# Patient Record
Sex: Female | Born: 1965 | Race: Asian | Hispanic: No | Marital: Married | Smoking: Never smoker
Health system: Southern US, Community
[De-identification: ages and names within clinical notes are randomized; demographics above are authoritative.]

## PROBLEM LIST (undated history)

## (undated) DIAGNOSIS — J45909 Unspecified asthma, uncomplicated: Secondary | ICD-10-CM

## (undated) DIAGNOSIS — M1990 Unspecified osteoarthritis, unspecified site: Secondary | ICD-10-CM

## (undated) DIAGNOSIS — Z87898 Personal history of other specified conditions: Secondary | ICD-10-CM

## (undated) DIAGNOSIS — T7840XA Allergy, unspecified, initial encounter: Secondary | ICD-10-CM

## (undated) DIAGNOSIS — I10 Essential (primary) hypertension: Secondary | ICD-10-CM

## (undated) DIAGNOSIS — Z8719 Personal history of other diseases of the digestive system: Secondary | ICD-10-CM

## (undated) HISTORY — DX: Unspecified osteoarthritis, unspecified site: M19.90

## (undated) HISTORY — PX: APPENDECTOMY: SHX54

## (undated) HISTORY — DX: Unspecified asthma, uncomplicated: J45.909

## (undated) HISTORY — DX: Personal history of other specified conditions: Z87.898

## (undated) HISTORY — PX: TUBAL LIGATION: SHX77

## (undated) HISTORY — DX: Allergy, unspecified, initial encounter: T78.40XA

## (undated) HISTORY — DX: Personal history of other diseases of the digestive system: Z87.19

---

## 1996-01-20 HISTORY — PX: CHOLECYSTECTOMY: SHX55

## 2010-01-19 HISTORY — PX: LAPAROSCOPY: SHX197

## 2010-03-13 ENCOUNTER — Emergency Department (HOSPITAL_BASED_OUTPATIENT_CLINIC_OR_DEPARTMENT_OTHER)
Admission: EM | Admit: 2010-03-13 | Discharge: 2010-03-14 | Disposition: A | Discharge status: Home / Self Care | Payer: Managed Care, Other (non HMO) | Attending: Emergency Medicine | Admitting: Emergency Medicine

## 2010-03-13 ENCOUNTER — Emergency Department (INDEPENDENT_AMBULATORY_CARE_PROVIDER_SITE_OTHER): Payer: Managed Care, Other (non HMO)

## 2010-03-13 DIAGNOSIS — K5732 Diverticulitis of large intestine without perforation or abscess without bleeding: Secondary | ICD-10-CM | POA: Insufficient documentation

## 2010-03-13 DIAGNOSIS — R197 Diarrhea, unspecified: Secondary | ICD-10-CM | POA: Insufficient documentation

## 2010-03-13 DIAGNOSIS — R112 Nausea with vomiting, unspecified: Secondary | ICD-10-CM | POA: Insufficient documentation

## 2010-03-13 DIAGNOSIS — R109 Unspecified abdominal pain: Secondary | ICD-10-CM | POA: Insufficient documentation

## 2010-03-13 DIAGNOSIS — R10819 Abdominal tenderness, unspecified site: Secondary | ICD-10-CM | POA: Insufficient documentation

## 2010-03-13 LAB — DIFFERENTIAL
Basophils Absolute: 0 10*3/uL (ref 0.0–0.1)
Basophils Relative: 0 % (ref 0–1)
Eosinophils Absolute: 0 10*3/uL (ref 0.0–0.7)
Eosinophils Relative: 0 % (ref 0–5)
Lymphocytes Relative: 14 % (ref 12–46)
Monocytes Absolute: 1 10*3/uL (ref 0.1–1.0)

## 2010-03-13 LAB — URINALYSIS, ROUTINE W REFLEX MICROSCOPIC
Ketones, ur: 15 mg/dL — AB
Nitrite: NEGATIVE
Protein, ur: NEGATIVE mg/dL
Urobilinogen, UA: 0.2 mg/dL (ref 0.0–1.0)

## 2010-03-13 LAB — COMPREHENSIVE METABOLIC PANEL
ALT: 24 U/L (ref 0–35)
Albumin: 4.3 g/dL (ref 3.5–5.2)
Calcium: 9.5 mg/dL (ref 8.4–10.5)
Glucose, Bld: 88 mg/dL (ref 70–99)
Sodium: 140 mEq/L (ref 135–145)
Total Protein: 8.2 g/dL (ref 6.0–8.3)

## 2010-03-13 LAB — LIPASE, BLOOD: Lipase: 85 U/L (ref 23–300)

## 2010-03-13 LAB — CBC
HCT: 42.1 % (ref 36.0–46.0)
MCHC: 35.2 g/dL (ref 30.0–36.0)
Platelets: 258 10*3/uL (ref 150–400)
RDW: 12.2 % (ref 11.5–15.5)

## 2010-03-13 MED ORDER — IOHEXOL 300 MG/ML  SOLN
100.0000 mL | Freq: Once | INTRAMUSCULAR | Status: AC | PRN
  Administered 2010-03-13: 100 mL via INTRAVENOUS

## 2010-10-28 ENCOUNTER — Ambulatory Visit (INDEPENDENT_AMBULATORY_CARE_PROVIDER_SITE_OTHER): Payer: Self-pay | Admitting: General Surgery

## 2010-11-18 ENCOUNTER — Ambulatory Visit (INDEPENDENT_AMBULATORY_CARE_PROVIDER_SITE_OTHER): Payer: Self-pay | Admitting: General Surgery

## 2011-01-20 HISTORY — PX: SIGMOIDOSCOPY: SUR1295

## 2011-07-13 LAB — HM COLONOSCOPY

## 2011-08-20 LAB — LIPID PANEL
CHOLESTEROL: 102 mg/dL (ref 0–200)
HDL: 36 mg/dL (ref 35–70)
LDL Cholesterol: 54 mg/dL
Triglycerides: 60 mg/dL (ref 40–160)

## 2011-09-01 LAB — HM PAP SMEAR: HM Pap smear: NORMAL

## 2011-09-09 LAB — HM MAMMOGRAPHY

## 2011-10-21 LAB — HM SIGMOIDOSCOPY

## 2012-01-20 HISTORY — PX: HERNIA REPAIR: SHX51

## 2012-08-16 LAB — HM MAMMOGRAPHY: HM Mammogram: NORMAL

## 2013-04-18 LAB — CBC AND DIFFERENTIAL
HEMATOCRIT: 41 % (ref 36–46)
Hemoglobin: 14.1 g/dL (ref 12.0–16.0)
Platelets: 309 10*3/uL (ref 150–399)
WBC: 5.7 10*3/mL

## 2013-04-18 LAB — BASIC METABOLIC PANEL
BUN: 6 mg/dL (ref 4–21)
Creatinine: 0.6 mg/dL (ref <=1.1)
GLUCOSE: 79 mg/dL
POTASSIUM: 3.6 mmol/L (ref 3.4–5.3)
SODIUM: 140 mmol/L (ref 137–147)

## 2013-04-18 LAB — HEPATIC FUNCTION PANEL
ALT: 19 U/L (ref 7–35)
AST: 36 U/L — AB (ref 13–35)
BILIRUBIN, TOTAL: 0.5 mg/dL

## 2013-12-19 ENCOUNTER — Encounter: Payer: Self-pay | Admitting: *Deleted

## 2013-12-19 ENCOUNTER — Ambulatory Visit (INDEPENDENT_AMBULATORY_CARE_PROVIDER_SITE_OTHER): Payer: BC Managed Care – PPO | Admitting: Internal Medicine

## 2013-12-19 ENCOUNTER — Encounter: Payer: Self-pay | Admitting: Internal Medicine

## 2013-12-19 VITALS — BP 110/72 | HR 82 | Temp 98.2°F | Resp 10 | Ht 63.0 in | Wt 139.0 lb

## 2013-12-19 DIAGNOSIS — G43909 Migraine, unspecified, not intractable, without status migrainosus: Secondary | ICD-10-CM | POA: Insufficient documentation

## 2013-12-19 DIAGNOSIS — K5900 Constipation, unspecified: Secondary | ICD-10-CM | POA: Insufficient documentation

## 2013-12-19 DIAGNOSIS — G43109 Migraine with aura, not intractable, without status migrainosus: Secondary | ICD-10-CM

## 2013-12-19 DIAGNOSIS — J45909 Unspecified asthma, uncomplicated: Secondary | ICD-10-CM | POA: Insufficient documentation

## 2013-12-19 DIAGNOSIS — J4520 Mild intermittent asthma, uncomplicated: Secondary | ICD-10-CM

## 2013-12-19 DIAGNOSIS — I83813 Varicose veins of bilateral lower extremities with pain: Secondary | ICD-10-CM

## 2013-12-19 MED ORDER — SUMATRIPTAN SUCCINATE 50 MG PO TABS: 50.0000 mg | ORAL_TABLET | Freq: Every day | ORAL | Status: DC | PRN

## 2013-12-19 NOTE — Progress Notes (Signed)
Patient ID: Linda Sloan, female   DOB: 10-Feb-1965, 48 y.o.   MRN: [id number]    Chief Complaint  Patient presents with  . Establish Care    New patient establish care   . Leg Pain    Bilateral leg pain, right leg is worse off/on. Right thigh feels numb at time since lipoma removed   Allergies  Allergen Reactions  . Clindamycin/Lincomycin   . Flagyl [Metronidazole]    HPI 48 y/o female patient is here to establish care. She was seeing Dr Montez Moritaarter at [facility] [place] prior to this. Last visit was in may 2015 as per pt She has hx of recurrent diverticulitis and underwent partial colectomy with anastomosis 2 years back. No complications reported. She also had hx of paravertebral lumbar muscle hypertrophy, underwent transverse TTP  She has hx of migraine, GERD, constipation. Currently off reflux medicine Has headache almost every other day and this could last for more than half the day. Light and nosie worsens it. Taking excedrin without much help.  She travelled to [place] in September and in order to delay her period started pill on 09/22/13-10/16/13. She had withdrawal bleeding after stopping her pills. While on pill, during the travel she felt pain in both her calf muscles right > left. She walked non stop for 5 days and then noticed swelling with bluish-greenish discoloration on her calf. The color has normalized but she continues to feels intermittent pain in her legs, both at rest and with exertion.   Review of Systems  Constitutional: Negative for fever, chills, diaphoresis.  HENT: Negative for congestion, hearing loss and sore throat.   Eyes: Negative for blurred vision, double vision and discharge.  Respiratory: Negative for cough, sputum production, shortness of breath and wheezing.   Cardiovascular: Negative for chest pain, palpitations, orthopnea and leg swelling.  Gastrointestinal: Negative for heartburn, nausea, vomiting, abdominal pain, diarrhea. Has regular bowel  movement on miralax and vegetable smoothie.   Genitourinary: Negative for dysuria, urgency, frequency and flank pain.  Musculoskeletal: Negative for back pain, falls Skin: Negative for itching and rash.  Neurological:  Negative for dizziness, tingling, focal weakness  Psychiatric/Behavioral: Negative for depression and memory loss. The patient is not nervous/anxious.    Past Medical History  Diagnosis Date  . History of diverticulitis of colon     Resolved 01/2012  . History of abdominal pain     Abdominal tenderness, LLQ   Past Surgical History  Procedure Laterality Date  . Cholecystectomy  1998  . Hernia repair  2014    Lipoma   Lancer, MD  . Sigmoidoscopy N/A 2013    Fiberoptic Olin PiaJames Dasher, MD  . Laparoscopy  2012    Corrie DandyMary D. Lanae BoastShearin, MD   No current outpatient prescriptions on file prior to visit.   No current facility-administered medications on file prior to visit.   Family History  Problem Relation Age of Onset  . Stroke Mother 2181  . Diabetes Mother 2481  . Arthritis Mother 5345  . Heart disease Father 40    heart attack  . Stroke Father 1888  . Cancer Brother 4260    History   Social History  . Marital Status: Married    Spouse Name: N/A    Number of Children: N/A  . Years of Education: N/A   Social History Main Topics  . Smoking status: Never Smoker   . Smokeless tobacco: Never Used  . Alcohol Use: No  . Drug Use: No  . Sexual Activity: None  Other Topics Concern  . None   Social History Narrative    Physical exam BP 110/72 mmHg  Pulse 82  Temp(Src) 98.2 F (36.8 C) (Oral)  Resp 10  Ht 5\' 3"  (1.6 m)  Wt 139 lb (63.05 kg)  BMI 24.63 kg/m2  SpO2 99%  General- adult female in no acute distress Head- atraumatic, normocephalic Eyes- PERRLA, EOMI, no pallor, no icterus, no discharge Neck- no lymphadenopathy Mouth- normal mucus membrane Cardiovascular- normal s1,s2, no murmurs, normal distal pulses Respiratory- bilateral clear to auscultation,  no wheeze, no rhonchi, no crackles Abdomen- bowel sounds present, soft, non tender Musculoskeletal- able to move all 4 extremities, no spinal and paraspinal tenderness, steady gait, no use of assistive device, normal range of motion, no leg edema Neurological- no focal deficit, normal muscle strength, normal sensation to fine touch Skin- warm and dry, varicose veings in both legs, right > left Psychiatry- alert and oriented to person, place and time, normal mood and affect  Labs- 04/18/13 wbc 5.7, hb 14.7, hct 40.7, plt 309, na 140, k 3.6, ca 8.9, glu 79, bun 6, cr 0.60, lft wnl  imaging Ct abdomen and pelvis - no acute finding, s/p cholecystectomy, stable small paraumbilical hernia containing omental fat.   Assessment/plan  1. Migraine with aura and without status migrainosus, not intractable D/c excedrin. Start sumitritpan 50 mg daily prn for now and reassess  2. Asthma, chronic, mild intermittent, uncomplicated Stable, off all meds at present  3. Constipation, unspecified constipation type Continue miralax and fiber supplement  4. Varicose veins of both lower extremities with pain The pain is likely from her varicose veins, check venous doppler to assess for venous blood flow, reflux and obstruction. She has Good distal pulses, no wound/ sores Advised on leg elevation at rest and to wear compression stockings. Reassess in 8 weeks or earlier  If venous doppler is normal, consider ABI to assess further - Lower Extremity Venous Duplex Bilateral; Future  Will need her prior records, review them and order labs

## 2014-01-01 ENCOUNTER — Ambulatory Visit (HOSPITAL_COMMUNITY)
Admission: RE | Admit: 2014-01-01 | Discharge: 2014-01-01 | Disposition: A | Discharge status: Home / Self Care | Payer: BC Managed Care – PPO | Source: Ambulatory Visit | Attending: Surgery | Admitting: Surgery

## 2014-01-01 DIAGNOSIS — I83813 Varicose veins of bilateral lower extremities with pain: Secondary | ICD-10-CM | POA: Diagnosis present

## 2014-02-27 ENCOUNTER — Ambulatory Visit: Payer: BC Managed Care – PPO | Admitting: Internal Medicine

## 2014-03-06 ENCOUNTER — Ambulatory Visit: Payer: Self-pay | Admitting: Internal Medicine

## 2014-03-13 ENCOUNTER — Encounter: Payer: Self-pay | Admitting: Internal Medicine

## 2014-05-07 ENCOUNTER — Ambulatory Visit (INDEPENDENT_AMBULATORY_CARE_PROVIDER_SITE_OTHER): Payer: BLUE CROSS/BLUE SHIELD | Admitting: Nurse Practitioner

## 2014-05-07 ENCOUNTER — Encounter: Payer: Self-pay | Admitting: Nurse Practitioner

## 2014-05-07 VITALS — BP 122/70 | HR 88 | Temp 98.1°F | Resp 18 | Ht 63.0 in | Wt 137.2 lb

## 2014-05-07 DIAGNOSIS — G43109 Migraine with aura, not intractable, without status migrainosus: Secondary | ICD-10-CM | POA: Diagnosis not present

## 2014-05-07 DIAGNOSIS — I83813 Varicose veins of bilateral lower extremities with pain: Secondary | ICD-10-CM | POA: Diagnosis not present

## 2014-05-07 NOTE — Patient Instructions (Signed)
Follow up in 1 month for EV with fasting blood work prior to visit   

## 2014-05-07 NOTE — Progress Notes (Signed)
Patient ID: Linda Sloan, female   DOB: 07/08/1965, 49 y.o.   MRN: [id number]    PCP: Sharon Seller, NP  Allergies  Allergen Reactions  . Clindamycin/Lincomycin   . Flagyl [Metronidazole]     Chief Complaint  Patient presents with  . Medical Management of Chronic Issues     HPI: Patient is a 49 y.o. female seen in the office today to follow up on chronic conditions. Pt was previously seen at [facility] but has now transferred to [facility] for care. Has not had recent blood work, reports she is due to for physical soon. Since last visit she has had to go to the ED once due to headaches since she has started Imitrex (which is better than previous). Does have some dizziness when she takes the medication but helps greatly with the headaches.  Reports she is still having leg pain. Still paying for the doppler that she had done. -doppler was negative for DVT.  -had worsening pain and saw doctor through work who prescribed diclofenac, she has only used once but does help -pain appears to be related to varicosities in her legs Miralax every other day which helps constipation  Review of Systems:  Review of Systems  Constitutional: Negative for activity change, appetite change, fatigue and unexpected weight change.  HENT: Negative for congestion and hearing loss.   Eyes: Negative.   Respiratory: Negative for cough and shortness of breath.   Cardiovascular: Negative for chest pain, palpitations and leg swelling.  Gastrointestinal: Positive for abdominal distention (bloating, cramps). Negative for abdominal pain, diarrhea and constipation.       Feeling of "inflammation", had colon surgery due to diverticulitis 3 years ago.   Genitourinary: Negative for dysuria and difficulty urinating.  Musculoskeletal: Positive for back pain. Negative for myalgias and arthralgias.  Skin: Negative for color change and wound.  Neurological: Negative for dizziness and weakness.  Psychiatric/Behavioral:  Negative for behavioral problems, confusion and agitation.    Past Medical History  Diagnosis Date  . History of diverticulitis of colon     Resolved 01/2012  . History of abdominal pain     Abdominal tenderness, LLQ   Past Surgical History  Procedure Laterality Date  . Cholecystectomy  1998  . Hernia repair  2014    Lipoma   Lancer, MD  . Sigmoidoscopy N/A 2013    Fiberoptic Olin Pia, MD  . Laparoscopy  2012    Corrie Dandy D. Lanae Boast, MD   Social History:   reports that she has never smoked. She has never used smokeless tobacco. She reports that she does not drink alcohol or use illicit drugs.  Family History  Problem Relation Age of Onset  . Stroke Mother 54  . Diabetes Mother 70  . Arthritis Mother 11  . Heart disease Father 40    heart attack  . Stroke Father 80  . Cancer Brother 71    Medications: Patient's Medications  New Prescriptions   No medications on file  Previous Medications   DICLOFENAC (VOLTAREN) 50 MG EC TABLET    Take 50 mg by mouth 3 (three) times daily as needed for mild pain (in calf of both legs).   POLYETHYLENE GLYCOL (MIRALAX / GLYCOLAX) PACKET    Take 17 g by mouth 2 (two) times a week.   SUMATRIPTAN (IMITREX) 50 MG TABLET    Take 1 tablet (50 mg total) by mouth daily as needed for migraine or headache. May repeat in 2 hours if headache persists or recurs.  Modified Medications   No medications on file  Discontinued Medications   No medications on file     Physical Exam:  Filed Vitals:   05/07/14 1441  BP: 122/70  Pulse: 88  Temp: 98.1 F (36.7 C)  TempSrc: Oral  Resp: 18  Height: 5\' 3"  (1.6 m)  Weight: 137 lb 3.2 oz (62.234 kg)  SpO2: 98%    Physical Exam  Constitutional: She is oriented to person, place, and time. She appears well-developed and well-nourished. No distress.  HENT:  Head: Normocephalic and atraumatic.  Mouth/Throat: Oropharynx is clear and moist. No oropharyngeal exudate.  Eyes: Conjunctivae are normal. Pupils  are equal, round, and reactive to light.  Neck: Normal range of motion. Neck supple.  Cardiovascular: Normal rate, regular rhythm and normal heart sounds.   varicose veins noted to bilateral LE  Pulmonary/Chest: Effort normal and breath sounds normal.  Abdominal: Soft. Bowel sounds are normal.  Musculoskeletal: She exhibits no edema or tenderness.  Neurological: She is alert and oriented to person, place, and time.  Skin: Skin is warm and dry. No rash noted. She is not diaphoretic. No erythema.  Psychiatric: She has a normal mood and affect.    Labs reviewed: Basic Metabolic Panel: No results for input(s): NA, K, CL, CO2, GLUCOSE, BUN, CREATININE, CALCIUM, MG, PHOS, TSH in the last 8760 hours. Liver Function Tests: No results for input(s): AST, ALT, ALKPHOS, BILITOT, PROT, ALBUMIN in the last 8760 hours. No results for input(s): LIPASE, AMYLASE in the last 8760 hours. No results for input(s): AMMONIA in the last 8760 hours. CBC: No results for input(s): WBC, NEUTROABS, HGB, HCT, MCV, PLT in the last 8760 hours. Lipid Panel: No results for input(s): CHOL, HDL, LDLCALC, TRIG, CHOLHDL, LDLDIRECT in the last 8760 hours. TSH: No results for input(s): TSH in the last 8760 hours. A1C: No results found for: HGBA1C   Assessment/Plan 1. Migraine with aura and without status migrainosus, not intractable -improved on imitrex as needed  2. Varicose veins of both lower extremities with pain -has used compression socks from store, which has helped but they are already wornout.  - Compression stockings rx given -may cont diclofenac PRN  3. Constipation  Controlled with miralax  -follow up in 1 month with fasting blood work prior to appt for physical

## 2014-05-08 ENCOUNTER — Ambulatory Visit: Payer: Self-pay | Admitting: Internal Medicine

## 2014-05-29 ENCOUNTER — Encounter: Payer: Self-pay | Admitting: Nurse Practitioner

## 2014-05-30 ENCOUNTER — Other Ambulatory Visit: Payer: BLUE CROSS/BLUE SHIELD

## 2014-05-30 ENCOUNTER — Other Ambulatory Visit: Payer: Self-pay | Admitting: *Deleted

## 2014-05-30 ENCOUNTER — Telehealth: Payer: Self-pay

## 2014-05-30 DIAGNOSIS — K5909 Other constipation: Secondary | ICD-10-CM

## 2014-05-30 DIAGNOSIS — J4520 Mild intermittent asthma, uncomplicated: Secondary | ICD-10-CM

## 2014-05-30 DIAGNOSIS — G43109 Migraine with aura, not intractable, without status migrainosus: Secondary | ICD-10-CM

## 2014-05-30 NOTE — Telephone Encounter (Signed)
Patient stopped by the office to request results of Venous Doppler completed December 2015. Patient states no one ever gave her the results of that report.   Patient filled out walk-in triage form and left.   I reviewed report and understood the impression to save Neg for DVT, other notes under impression need to be interpreted, please advise

## 2014-05-30 NOTE — Telephone Encounter (Signed)
Appears pt has follow up next week, please print report for review during OV

## 2014-05-30 NOTE — Telephone Encounter (Signed)
Linda BeechamCynthia spoke with patient and gave her a copy of report to discuss at pending appointment

## 2014-05-31 ENCOUNTER — Other Ambulatory Visit: Payer: BLUE CROSS/BLUE SHIELD

## 2014-05-31 LAB — CBC WITH DIFFERENTIAL/PLATELET
BASOS ABS: 0 10*3/uL (ref 0.0–0.2)
Basos: 0 %
EOS (ABSOLUTE): 0.1 10*3/uL (ref 0.0–0.4)
EOS: 2 %
Hematocrit: 45 % (ref 34.0–46.6)
Hemoglobin: 14.6 g/dL (ref 11.1–15.9)
Immature Grans (Abs): 0 10*3/uL (ref 0.0–0.1)
Immature Granulocytes: 0 %
LYMPHS ABS: 1.7 10*3/uL (ref 0.7–3.1)
LYMPHS: 34 %
MCH: 28.7 pg (ref 26.6–33.0)
MCHC: 32.4 g/dL (ref 31.5–35.7)
MCV: 88 fL (ref 79–97)
MONOCYTES: 8 %
Monocytes Absolute: 0.4 10*3/uL (ref 0.1–0.9)
NEUTROS PCT: 56 %
Neutrophils Absolute: 2.7 10*3/uL (ref 1.4–7.0)
PLATELETS: 263 10*3/uL (ref 150–379)
RBC: 5.09 x10E6/uL (ref 3.77–5.28)
RDW: 12.6 % (ref 12.3–15.4)
WBC: 5 10*3/uL (ref 3.4–10.8)

## 2014-05-31 LAB — COMPREHENSIVE METABOLIC PANEL
ALBUMIN: 4.2 g/dL (ref 3.5–5.5)
ALK PHOS: 72 IU/L (ref 39–117)
ALT: 33 IU/L — ABNORMAL HIGH (ref 0–32)
AST: 39 IU/L (ref 0–40)
Albumin/Globulin Ratio: 1.4 (ref 1.1–2.5)
BUN / CREAT RATIO: 15 (ref 9–23)
BUN: 11 mg/dL (ref 6–24)
Bilirubin Total: 0.3 mg/dL (ref 0.0–1.2)
CALCIUM: 9.4 mg/dL (ref 8.7–10.2)
CHLORIDE: 99 mmol/L (ref 97–108)
CO2: 24 mmol/L (ref 18–29)
Creatinine, Ser: 0.74 mg/dL (ref 0.57–1.00)
GFR calc Af Amer: 108 mL/min/{1.73_m2} (ref >59)
GFR calc non Af Amer: 94 mL/min/{1.73_m2} (ref >59)
Globulin, Total: 2.9 g/dL (ref 1.5–4.5)
Glucose: 88 mg/dL (ref 65–99)
Potassium: 4.3 mmol/L (ref 3.5–5.2)
Sodium: 139 mmol/L (ref 134–144)
Total Protein: 7.1 g/dL (ref 6.0–8.5)

## 2014-05-31 LAB — LIPID PANEL
CHOL/HDL RATIO: 3.4 ratio (ref 0.0–4.4)
CHOLESTEROL TOTAL: 125 mg/dL (ref 100–199)
HDL: 37 mg/dL — AB (ref >39)
LDL CALC: 74 mg/dL (ref 0–99)
Triglycerides: 68 mg/dL (ref 0–149)
VLDL CHOLESTEROL CAL: 14 mg/dL (ref 5–40)

## 2014-06-05 ENCOUNTER — Encounter: Payer: Self-pay | Admitting: Nurse Practitioner

## 2014-06-05 ENCOUNTER — Ambulatory Visit (INDEPENDENT_AMBULATORY_CARE_PROVIDER_SITE_OTHER): Payer: BLUE CROSS/BLUE SHIELD | Admitting: Nurse Practitioner

## 2014-06-05 VITALS — BP 102/68 | HR 93 | Temp 97.8°F | Resp 18 | Ht 63.0 in | Wt 138.6 lb

## 2014-06-05 DIAGNOSIS — R109 Unspecified abdominal pain: Secondary | ICD-10-CM

## 2014-06-05 DIAGNOSIS — G43109 Migraine with aura, not intractable, without status migrainosus: Secondary | ICD-10-CM | POA: Diagnosis not present

## 2014-06-05 DIAGNOSIS — I83813 Varicose veins of bilateral lower extremities with pain: Secondary | ICD-10-CM

## 2014-06-05 DIAGNOSIS — Z0000 Encounter for general adult medical examination without abnormal findings: Secondary | ICD-10-CM

## 2014-06-05 LAB — POCT URINALYSIS DIPSTICK
Bilirubin, UA: NEGATIVE
Blood, UA: NEGATIVE
GLUCOSE UA: NEGATIVE
KETONES UA: NEGATIVE
LEUKOCYTES UA: NEGATIVE
Nitrite, UA: NEGATIVE
PROTEIN UA: NEGATIVE
Spec Grav, UA: 1.01
Urobilinogen, UA: 0.2
pH, UA: 5

## 2014-06-05 MED ORDER — LUBIPROSTONE 8 MCG PO CAPS: 8.0000 ug | ORAL_CAPSULE | Freq: Two times a day (BID) | ORAL | Status: DC

## 2014-06-05 NOTE — Patient Instructions (Addendum)
Urine is negative, will treat for constipation with amitiza 8 mcg by mouth twice daily with food   Follow up in 2-3 weeks

## 2014-06-05 NOTE — Progress Notes (Signed)
Patient ID: Linda Sloan, female   DOB: 07-27-1965, 49 y.o.   MRN: [id number]    PCP: Sharon Seller, NP  Allergies  Allergen Reactions  . Clindamycin/Lincomycin   . Flagyl [Metronidazole]     Chief Complaint  Patient presents with  . Annual Exam    Annual exam, Discuss labs (copy printed )     HPI: Patient is a 49 y.o. female seen in the office today for extended visit.  Following with GYN for Pelvic/PAP, also gets clinical breast exam there. Increased abdominal pain, contributes to inflammatory process ?GYN issues.  Screenings: Colon Cancer- colonoscopy 2013 Breast Cancer- mammogram 11/2013  Cervical Cancer- PAP in 2013   Vaccines Up to date on: influenza Need: Tdap  Smoking status: nonsmoker Alcohol use: never  Dentist: every 6 months Ophthalmologist: yearly  Exercise regimen: walking daily- 30-40 mins Diet: attempts heart healthy   Specialist- both at [facility]  GI- Dr. Jasmine December GYN- Dr. Patton Salles  Reports increased abdominal pain, worse than before, feels bloated, more constipated, miralax makes it worse  Migraine medication makes her dizzy but does help her migraines.   Advanced Directive information Does patient have an advance directive?: No, Does patient want to make changes to advanced directive?: Yes - information given Review of Systems:  Review of Systems  Constitutional: Negative for activity change, appetite change, fatigue and unexpected weight change.  HENT: Negative for congestion and hearing loss.   Eyes: Negative.   Respiratory: Negative for cough and shortness of breath.   Cardiovascular: Negative for chest pain, palpitations and leg swelling.  Gastrointestinal: Positive for abdominal pain, constipation and abdominal distention. Negative for nausea, vomiting and diarrhea.       Feels something is not right on the inside of abdomen- started 2 months ago.  Feels constipated but feels like miralax makes it worse.      Genitourinary: Positive for frequency. Negative for dysuria and difficulty urinating.  Musculoskeletal: Positive for back pain. Negative for myalgias and arthralgias.  Skin: Negative for color change and wound.  Neurological: Negative for dizziness and weakness.  Psychiatric/Behavioral: Negative for behavioral problems, confusion and agitation.    Past Medical History  Diagnosis Date  . History of diverticulitis of colon     Resolved 01/2012  . History of abdominal pain     Abdominal tenderness, LLQ   Past Surgical History  Procedure Laterality Date  . Cholecystectomy  1998  . Hernia repair  2014    Lipoma   Lancer, MD  . Sigmoidoscopy N/A 2013    Fiberoptic Olin Pia, MD  . Laparoscopy  2012    Corrie Dandy D. Lanae Boast, MD   Social History:   reports that she has never smoked. She has never used smokeless tobacco. She reports that she does not drink alcohol or use illicit drugs.  Family History  Problem Relation Age of Onset  . Stroke Mother 7  . Diabetes Mother 34  . Arthritis Mother 34  . Heart disease Father 40    heart attack  . Stroke Father 2  . Cancer Brother 71    Medications: Patient's Medications  New Prescriptions   No medications on file  Previous Medications   DICLOFENAC (VOLTAREN) 50 MG EC TABLET    Take 50 mg by mouth 3 (three) times daily as needed for mild pain (in calf of both legs).   POLYETHYLENE GLYCOL (MIRALAX / GLYCOLAX) PACKET    Take 17 g by mouth 2 (two) times a week.   SUMATRIPTAN (  IMITREX) 50 MG TABLET    Take 1 tablet (50 mg total) by mouth daily as needed for migraine or headache. May repeat in 2 hours if headache persists or recurs.  Modified Medications   No medications on file  Discontinued Medications   No medications on file     Physical Exam:  Filed Vitals:   06/05/14 0900  BP: 102/68  Pulse: 93  Temp: 97.8 F (36.6 C)  TempSrc: Oral  Resp: 18  Height: 5\' 3"  (1.6 m)  Weight: 138 lb 9.6 oz (62.869 kg)  SpO2: 97%     Physical Exam  Constitutional: She is oriented to person, place, and time. She appears well-developed and well-nourished. No distress.  HENT:  Head: Normocephalic and atraumatic.  Mouth/Throat: Oropharynx is clear and moist. No oropharyngeal exudate.  Eyes: Conjunctivae are normal. Pupils are equal, round, and reactive to light.  Neck: Normal range of motion. Neck supple.  Cardiovascular: Normal rate, regular rhythm and normal heart sounds.   Pulmonary/Chest: Effort normal and breath sounds normal.  Abdominal: Soft. Bowel sounds are normal. There is tenderness (to lower abdomen).  Genitourinary:  Deferred- pelvis and breast exams being done by GYN  Musculoskeletal: She exhibits no edema or tenderness.  Neurological: She is alert and oriented to person, place, and time.  Skin: Skin is warm and dry. She is not diaphoretic.  Psychiatric: She has a normal mood and affect.    Labs reviewed: Basic Metabolic Panel:  Recent Labs  81/19/1403/12/05 0827  NA 139  K 4.3  CL 99  CO2 24  GLUCOSE 88  BUN 11  CREATININE 0.74  CALCIUM 9.4   Liver Function Tests:  Recent Labs  05/30/14 0827  AST 39  ALT 33*  ALKPHOS 72  BILITOT 0.3  PROT 7.1   No results for input(s): LIPASE, AMYLASE in the last 8760 hours. No results for input(s): AMMONIA in the last 8760 hours. CBC:  Recent Labs  05/30/14 0904  WBC 5.0  NEUTROABS 2.7  HCT 45.0   Lipid Panel:  Recent Labs  05/30/14 0827  CHOL 125  HDL 37*  LDLCALC 74  TRIG 68  CHOLHDL 3.4   TSH: No results for input(s): TSH in the last 8760 hours. A1C: No results found for: HGBA1C   Assessment/Plan 1. Abdominal pain, unspecified abdominal location -with cramping, may be related to constipation vs inflammatory process, pt with hx of diverticulitis. Recent CBC reviewed without abnormal WBCs, - POC Urinalysis Dipstick- reviewed and negative  -will treat for constipation  - lubiprostone (AMITIZA) 8 MCG capsule; Take 1 capsule (8  mcg total) by mouth 2 (two) times daily with a meal. -also to take florastor PO BID   2. Preventative health care -normal exam, except mild discomfort on abdominal exam  -PREVENTIVE COUNSELING:  The patient was counseled regarding the appropriate use of alcohol, regular self-examination of the breasts on a monthly basis, prevention of dental and periodontal disease, diet, regular sustained exercise for at least 30 minutes 5 times per week, routine screening interval for mammogram as recommended by the American Cancer Society and ACOG, importance of regular PAP smears, and recommended schedule for GI hemoccult testing, colonoscopy, cholesterol, thyroid and diabetes screening.  3. Migraine with aura and without status migrainosus, not intractable -may try to cut Imitrex 50 mg in half to help with side effects   4. Varicose veins of both lower extremities with pain -reviewed lower extremity venous duplex reflux evaluation in detail with pt, to cont to wear  compression hose.   Follow up in 2-3 weeks

## 2014-07-03 ENCOUNTER — Encounter: Payer: Self-pay | Admitting: Nurse Practitioner

## 2014-07-03 ENCOUNTER — Ambulatory Visit: Payer: BLUE CROSS/BLUE SHIELD | Admitting: Nurse Practitioner

## 2014-07-31 ENCOUNTER — Encounter: Payer: Self-pay | Admitting: *Deleted

## 2014-09-13 ENCOUNTER — Emergency Department (HOSPITAL_COMMUNITY)
Admission: EM | Admit: 2014-09-13 | Discharge: 2014-09-13 | Disposition: A | Discharge status: Home / Self Care | Payer: Worker's Compensation | Attending: Emergency Medicine | Admitting: Emergency Medicine

## 2014-09-13 ENCOUNTER — Encounter (HOSPITAL_COMMUNITY): Payer: Self-pay

## 2014-09-13 DIAGNOSIS — S199XXA Unspecified injury of neck, initial encounter: Secondary | ICD-10-CM | POA: Diagnosis present

## 2014-09-13 DIAGNOSIS — S3992XA Unspecified injury of lower back, initial encounter: Secondary | ICD-10-CM | POA: Insufficient documentation

## 2014-09-13 DIAGNOSIS — Z79899 Other long term (current) drug therapy: Secondary | ICD-10-CM | POA: Diagnosis not present

## 2014-09-13 DIAGNOSIS — Y92410 Unspecified street and highway as the place of occurrence of the external cause: Secondary | ICD-10-CM | POA: Diagnosis not present

## 2014-09-13 DIAGNOSIS — S0993XA Unspecified injury of face, initial encounter: Secondary | ICD-10-CM | POA: Insufficient documentation

## 2014-09-13 DIAGNOSIS — Y998 Other external cause status: Secondary | ICD-10-CM | POA: Diagnosis not present

## 2014-09-13 DIAGNOSIS — S8991XA Unspecified injury of right lower leg, initial encounter: Secondary | ICD-10-CM | POA: Insufficient documentation

## 2014-09-13 DIAGNOSIS — S59912A Unspecified injury of left forearm, initial encounter: Secondary | ICD-10-CM | POA: Diagnosis not present

## 2014-09-13 DIAGNOSIS — Y9389 Activity, other specified: Secondary | ICD-10-CM | POA: Diagnosis not present

## 2014-09-13 DIAGNOSIS — S8992XA Unspecified injury of left lower leg, initial encounter: Secondary | ICD-10-CM | POA: Diagnosis not present

## 2014-09-13 MED ORDER — METHOCARBAMOL 500 MG PO TABS: 500.0000 mg | ORAL_TABLET | Freq: Two times a day (BID) | ORAL | Status: DC

## 2014-09-13 MED ORDER — TRAMADOL HCL 50 MG PO TABS: 50.0000 mg | ORAL_TABLET | Freq: Four times a day (QID) | ORAL | Status: DC | PRN

## 2014-09-13 MED ORDER — TRAMADOL HCL 50 MG PO TABS
50.0000 mg | ORAL_TABLET | Freq: Once | ORAL | Status: AC
Start: 2014-09-13 — End: 2014-09-13
  Administered 2014-09-13: 50 mg via ORAL
  Filled 2014-09-13: qty 1
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
## 2014-09-13 NOTE — Discharge Instructions (Signed)

## 2014-10-31 ENCOUNTER — Other Ambulatory Visit: Payer: Self-pay | Admitting: Internal Medicine

## 2014-11-02 ENCOUNTER — Encounter: Payer: Self-pay | Admitting: Internal Medicine

## 2014-11-02 ENCOUNTER — Ambulatory Visit (INDEPENDENT_AMBULATORY_CARE_PROVIDER_SITE_OTHER): Payer: BLUE CROSS/BLUE SHIELD | Admitting: Internal Medicine

## 2014-11-02 VITALS — BP 120/82 | HR 80 | Temp 98.1°F | Ht 63.0 in | Wt 140.0 lb

## 2014-11-02 DIAGNOSIS — M25511 Pain in right shoulder: Secondary | ICD-10-CM

## 2014-11-02 DIAGNOSIS — M62838 Other muscle spasm: Secondary | ICD-10-CM | POA: Diagnosis not present

## 2014-11-02 DIAGNOSIS — M546 Pain in thoracic spine: Secondary | ICD-10-CM | POA: Diagnosis not present

## 2014-11-02 DIAGNOSIS — M542 Cervicalgia: Secondary | ICD-10-CM | POA: Diagnosis not present

## 2014-11-02 DIAGNOSIS — M545 Low back pain: Secondary | ICD-10-CM

## 2014-11-02 DIAGNOSIS — M25512 Pain in left shoulder: Secondary | ICD-10-CM

## 2014-11-02 MED ORDER — TRAMADOL HCL 50 MG PO TABS: 50.0000 mg | ORAL_TABLET | Freq: Four times a day (QID) | ORAL | Status: DC | PRN

## 2014-11-02 MED ORDER — METHYLPREDNISOLONE ACETATE 80 MG/ML IJ SUSP
80.0000 mg | Freq: Once | INTRAMUSCULAR | Status: AC
  Administered 2014-11-02: 80 mg via INTRAMUSCULAR

## 2014-11-02 MED ORDER — PREDNISONE 10 MG PO TABS: ORAL_TABLET | ORAL | Status: DC

## 2014-11-02 MED ORDER — METHOCARBAMOL 500 MG PO TABS: 500.0000 mg | ORAL_TABLET | Freq: Two times a day (BID) | ORAL | Status: DC

## 2014-11-02 NOTE — Patient Instructions (Addendum)
Check to see if insurance covers PT (physical therapy) or chiropractor for therapy  Take medications as ordered  No heavy lifting  Recommend Orthopedic eval and imaging studies (need xrays of neck, shoulder mid and lower back). May need MRI  DO NO START PREDNISONE UNTIL 11/03/14  Follow up as scheduled.  Hold flu/pneumovax until prednisone course completed
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
## 2015-04-11 DIAGNOSIS — Z23 Encounter for immunization: Secondary | ICD-10-CM

## 2015-04-29 NOTE — Congregational Nurse Program (Signed)
Congregational Nurse Program Note  Date of Encounter: 04/11/2015  Past Medical History: Past Medical History  Diagnosis Date  . History of diverticulitis of colon     Resolved 01/2012  . History of abdominal pain     Abdominal tenderness, LLQ    Encounter Details:     CNP Questionnaire - 04/29/15 1023    Patient Demographics   Is this a new or existing patient? New   Patient is considered a/an Not Applicable   Race Asian   Patient Assistance   Location of Patient [facility]   Patient's financial/insurance status Private Insurance Coverage   Uninsured Patient No   Patient referred to apply for the following financial assistance Not Applicable   Food insecurities addressed Not Applicable   Transportation assistance No   Assistance securing medications No   Educational health offerings Health literacy   Encounter Details   Primary purpose of visit Other  flu vaccine   Was an Emergency Department visit averted? No   Does patient have a medical provider? Yes   Patient referred to Not Applicable   Was a mental health screening completed? (GAINS tool) No   Does patient have dental issues? No   Does patient have vision issues? No   Since previous encounter, have you referred patient for abnormal blood pressure that resulted in a new diagnosis or medication change? No   Since previous encounter, have you referred patient for abnormal blood glucose that resulted in a new diagnosis or medication change? No   For Abstraction Use Only   Does patient have insurance? Yes       Client staff member who wants a flu vaccine, given to client

## 2015-05-09 ENCOUNTER — Telehealth: Payer: Self-pay | Admitting: Nurse Practitioner

## 2015-05-09 DIAGNOSIS — Z0000 Encounter for general adult medical examination without abnormal findings: Secondary | ICD-10-CM

## 2015-05-09 NOTE — Telephone Encounter (Signed)
Sent request to Clinical Lead for Lab orders via CPX on May 19th..Linda Sloan

## 2015-05-09 NOTE — Telephone Encounter (Signed)
Future orders placed 

## 2015-05-24 ENCOUNTER — Other Ambulatory Visit: Payer: BLUE CROSS/BLUE SHIELD

## 2015-05-24 DIAGNOSIS — Z0000 Encounter for general adult medical examination without abnormal findings: Secondary | ICD-10-CM

## 2015-05-25 LAB — CBC WITH DIFFERENTIAL/PLATELET
Basophils Absolute: 0 10*3/uL (ref 0.0–0.2)
Basos: 1 %
EOS (ABSOLUTE): 0.1 10*3/uL (ref 0.0–0.4)
EOS: 2 %
HEMATOCRIT: 43.5 % (ref 34.0–46.6)
HEMOGLOBIN: 14.8 g/dL (ref 11.1–15.9)
Immature Grans (Abs): 0 10*3/uL (ref 0.0–0.1)
Immature Granulocytes: 0 %
LYMPHS ABS: 1.5 10*3/uL (ref 0.7–3.1)
Lymphs: 35 %
MCH: 29.6 pg (ref 26.6–33.0)
MCHC: 34 g/dL (ref 31.5–35.7)
MCV: 87 fL (ref 79–97)
MONOCYTES: 9 %
Monocytes Absolute: 0.4 10*3/uL (ref 0.1–0.9)
NEUTROS ABS: 2.3 10*3/uL (ref 1.4–7.0)
Neutrophils: 53 %
Platelets: 278 10*3/uL (ref 150–379)
RBC: 5 x10E6/uL (ref 3.77–5.28)
RDW: 12.5 % (ref 12.3–15.4)
WBC: 4.3 10*3/uL (ref 3.4–10.8)

## 2015-05-25 LAB — BASIC METABOLIC PANEL
BUN / CREAT RATIO: 11 (ref 9–23)
BUN: 9 mg/dL (ref 6–24)
CHLORIDE: 101 mmol/L (ref 96–106)
CO2: 21 mmol/L (ref 18–29)
Calcium: 9 mg/dL (ref 8.7–10.2)
Creatinine, Ser: 0.79 mg/dL (ref 0.57–1.00)
GFR calc Af Amer: 100 mL/min/{1.73_m2} (ref >59)
GFR calc non Af Amer: 86 mL/min/{1.73_m2} (ref >59)
GLUCOSE: 91 mg/dL (ref 65–99)
Potassium: 4.3 mmol/L (ref 3.5–5.2)
SODIUM: 142 mmol/L (ref 134–144)

## 2015-05-25 LAB — LIPID PANEL
Chol/HDL Ratio: 3.6 ratio units (ref 0.0–4.4)
Cholesterol, Total: 120 mg/dL (ref 100–199)
HDL: 33 mg/dL — ABNORMAL LOW (ref >39)
LDL CALC: 73 mg/dL (ref 0–99)
Triglycerides: 68 mg/dL (ref 0–149)
VLDL CHOLESTEROL CAL: 14 mg/dL (ref 5–40)

## 2015-05-25 LAB — TSH: TSH: 5.57 u[IU]/mL — ABNORMAL HIGH (ref 0.450–4.500)

## 2015-06-03 ENCOUNTER — Ambulatory Visit (INDEPENDENT_AMBULATORY_CARE_PROVIDER_SITE_OTHER): Payer: Worker's Compensation | Admitting: Family Medicine

## 2015-06-03 ENCOUNTER — Ambulatory Visit: Payer: BLUE CROSS/BLUE SHIELD

## 2015-06-03 VITALS — BP 120/76 | HR 80 | Temp 99.0°F | Resp 16

## 2015-06-03 DIAGNOSIS — W19XXXA Unspecified fall, initial encounter: Secondary | ICD-10-CM

## 2015-06-03 DIAGNOSIS — M25571 Pain in right ankle and joints of right foot: Secondary | ICD-10-CM

## 2015-06-03 DIAGNOSIS — S96911A Strain of unspecified muscle and tendon at ankle and foot level, right foot, initial encounter: Secondary | ICD-10-CM | POA: Diagnosis not present

## 2015-06-03 DIAGNOSIS — S93401A Sprain of unspecified ligament of right ankle, initial encounter: Secondary | ICD-10-CM

## 2015-06-03 MED ORDER — IBUPROFEN 200 MG PO TABS
600.0000 mg | ORAL_TABLET | Freq: Once | ORAL | Status: AC
  Administered 2015-06-03: 600 mg via ORAL

## 2015-06-03 NOTE — Progress Notes (Signed)
Patient ID: Linda Sloan, female    DOB: Dec 24, 1965  Age: 50 y.o. MRN: [id number]  Chief Complaint  Patient presents with  . 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 She was on grass and doesn't know what made her fall, but she fell injuring her right foot. She thought it would be okay, but progressively it is developed more pain to where she cannot even get up to the bathroom. She is not taking any medications.  Current allergies, medications, problem list, past/family and social histories reviewed.  Objective:  BP 120/76 mmHg  Pulse 80  Temp(Src) 99 F (37.2 C) (Oral)  Resp 16  Ht   Wt   SpO2 100%  LMP 05/19/2015  Right foot has some swelling below the lateral malleolus. She has tenderness in multiple areas of foot, but especially across the midfoot, at the proximal first metatarsal, and some of the lateral aspect. It is most tender just anterior to the lateral malleolus.  Assessment & Plan:   Assessment: 1. Pain in joint, ankle and foot, right   2. Fall, initial encounter   3. Sprain of ankle, right, initial encounter   4. Strain of foot, right, initial encounter       Plan: Check x-rays  Orders Placed This Encounter  Procedures  . DG Foot 2 Views Right    Order Specific Question:  Reason for Exam (SYMPTOM  OR DIAGNOSIS REQUIRED)    Answer:  fell, turned foot, pain    Order Specific Question:  Is the patient pregnant?    Answer:  No     Comments:  lmp 1 week ago    Order Specific Question:  Preferred imaging location?    Answer:  External  . DG Ankle Complete Right    Order Specific Question:  Reason for Exam (SYMPTOM  OR DIAGNOSIS REQUIRED)    Answer:  fell, turned foot, pain    Order Specific Question:  Is the patient pregnant?    Answer:  No     Comments:  lmp 1 week ago    Order Specific Question:  Preferred imaging location?    Answer:  External    Meds  ordered this encounter  Medications  . ibuprofen (ADVIL,MOTRIN) tablet 600 mg    Sig:    No fracture seen. Radiologist reading is still pending  Will treat with crutches, Cam Walker, elevation, ice, ibuprofen.      Patient Instructions   Out of work until next Monday  Elevate foot as often as possible  Apply ice to foot and ankle for about 15 minutes 4 or 5 times daily for the next few days  Take ibuprofen 200 mg 2 or 3 pills every 6-8 hours as needed for pain  Use crutches  Wear cam walker  We will let you know if the radiologist sees anything differently on the x-rays.  Return next Monday or sooner if needed.    IF you received an x-ray today, you will receive an invoice from [facility]. Please contact [facility] at [phone] with questions or concerns regarding your invoice.   IF you received labwork today, you will receive an invoice from [employer]. Please contact [facility] at [phone] with questions or concerns regarding your invoice.   Our billing staff will not be able to assist you with questions regarding bills from these companies.  You will  be contacted with the lab results as soon as they are available. The fastest way to get your results is to activate your My Chart account. Instructions are located on the last page of this paperwork. If you have not heard from us regarding the results in 2 weeks, please contact this office.         Return in about 1 week (around 06/10/2015).   HOPPER,DAVID, MD 06/03/2015

## 2015-06-03 NOTE — Patient Instructions (Addendum)
Out of work until next Monday  Elevate foot as often as possible  Apply ice to foot and ankle for about 15 minutes 4 or 5 times daily for the next few days  Take ibuprofen 200 mg 2 or 3 pills every 6-8 hours as needed for pain  Use crutches  Wear cam walker  We will let you know if the radiologist sees anything differently on the x-rays.  Return next Monday or sooner if needed.    IF you received an x-ray today, you will receive an invoice from [facility]. Please contact [facility] at [phone] with questions or concerns regarding your invoice.   IF you received labwork today, you will receive an invoice from [facility]. Please contact [facility] at [phone] with questions or concerns regarding your invoice.   Our billing staff will not be able to assist you with questions regarding bills from these companies.  You will be contacted with the lab results as soon as they are available. The fastest way to get your results is to activate your My Chart account. Instructions are located on the last page of this paperwork. If you have not heard from us regarding the results in 2 weeks, please contact this office.

## 2015-06-07 ENCOUNTER — Encounter: Payer: BLUE CROSS/BLUE SHIELD | Admitting: Internal Medicine

## 2015-06-08 ENCOUNTER — Ambulatory Visit (INDEPENDENT_AMBULATORY_CARE_PROVIDER_SITE_OTHER): Payer: Worker's Compensation | Admitting: Physician Assistant

## 2015-06-08 VITALS — BP 104/62 | HR 78 | Temp 98.3°F | Resp 18 | Ht 63.0 in | Wt 143.0 lb

## 2015-06-08 DIAGNOSIS — W19XXXD Unspecified fall, subsequent encounter: Secondary | ICD-10-CM | POA: Diagnosis not present

## 2015-06-08 DIAGNOSIS — S93401A Sprain of unspecified ligament of right ankle, initial encounter: Secondary | ICD-10-CM

## 2015-06-08 DIAGNOSIS — M25571 Pain in right ankle and joints of right foot: Secondary | ICD-10-CM | POA: Diagnosis not present

## 2015-06-08 NOTE — Progress Notes (Signed)
MRN: [id number] DOB: [birth year]  Subjective:  Pt presents to clinic with an injury that occurred at work on 5/15 when she slipped.  She is feeling better.  She has been at home since the incident and has been walking without the camwalker and feels fine.  Today she has slightly more pain because she has been walking more than normal.  The pain is on the lateral aspect of the ankle mainly under the malleolus.  She has almost no swelling and no ecchymosis.    Review of Systems  Musculoskeletal: Positive for joint swelling (mild right ankle). Negative for gait problem.    Objective:  BP 104/62 mmHg  Pulse 78  Temp(Src) 98.3 F (36.8 C) (Oral)  Resp 18  Ht 5\' 3"  (1.6 m)  Wt 143 lb (64.864 kg)  BMI 25.34 kg/m2  SpO2 97%  LMP 05/19/2015  Physical Exam  Constitutional: She is oriented to person, place, and time and well-developed, well-nourished, and in no distress.  HENT:  Head: Normocephalic and atraumatic.  Right Ear: Hearing and external ear normal.  Left Ear: Hearing and external ear normal.  Eyes: Conjunctivae are normal.  Neck: Normal range of motion.  Pulmonary/Chest: Effort normal.  Musculoskeletal:       Right ankle: She exhibits swelling (distal and anterior to the lateral malleolus). She exhibits normal range of motion and no ecchymosis. Tenderness. AITFL tenderness found. No lateral malleolus and no medial malleolus tenderness found. Achilles tendon normal.       Left ankle: Normal.  Good strength, normal ROM without pain with flexion and extension of the foot, some pain with inversion and eversion of the ankle on the lateral aspect with both movements  Neurological: She is alert and oriented to person, place, and time. Gait normal.  Skin: Skin is warm and dry.  Psychiatric: Mood, memory, affect and judgment normal.  Vitals reviewed.   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

## 2015-06-08 NOTE — Progress Notes (Signed)
   Oren BinetFarkhanda Brook  MRN: [id number] DOB: Jun 15, 1965  Subjective:  Pt presents to clinic  Patient Active Problem List   Diagnosis Date Noted  . Migraine 12/19/2013  . Asthma, chronic 12/19/2013  . 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 CN (constipation) 12/19/2013    Current Outpatient Prescriptions on File Prior to Visit  Medication Sig Dispense Refill  . SUMAtriptan (IMITREX) 50 MG tablet TAKE 1 TABLET BY MOUTH DAILY AS NEEDED FOR MIGRAINE OR HEADACHE, MAY REPEAT IN 2 HOURS IF HEADACHE PERSISTS OR RECURS 10 tablet 0   No current facility-administered medications on file prior to visit.    Allergies  Allergen Reactions  . Clindamycin/Lincomycin   . Flagyl [Metronidazole]     Review of Systems Objective:  BP 104/62 mmHg  Pulse 78  Temp(Src) 98.3 F (36.8 C) (Oral)  Resp 18  Ht 5\' 3"  (1.6 m)  Wt 143 lb (64.864 kg)  BMI 25.34 kg/m2  SpO2 97%  LMP 05/19/2015  Physical Exam  Assessment and Plan :  Sprain of ankle, right, initial encounter  Pain in joint, ankle and foot, right  Fall, subsequent encounter  Benny LennertSarah Aastha Dayley PA-C  [facility] 06/08/2015 4:29 PM

## 2015-06-18 ENCOUNTER — Encounter: Payer: Self-pay | Admitting: Internal Medicine

## 2015-06-18 ENCOUNTER — Ambulatory Visit (INDEPENDENT_AMBULATORY_CARE_PROVIDER_SITE_OTHER): Payer: Worker's Compensation | Admitting: Physician Assistant

## 2015-06-18 VITALS — BP 98/60 | HR 77 | Temp 97.8°F | Resp 16 | Ht 63.0 in | Wt 142.6 lb

## 2015-06-18 DIAGNOSIS — M25571 Pain in right ankle and joints of right foot: Secondary | ICD-10-CM

## 2015-06-18 DIAGNOSIS — S93401D Sprain of unspecified ligament of right ankle, subsequent encounter: Secondary | ICD-10-CM

## 2015-06-18 NOTE — Progress Notes (Signed)
MRN: [id number] DOB: 01-15-1966  Subjective:  Pt presents to clinic with an injury that occurred at work on 06/03/2015.  Since she has been wearing the sweedo brace - the pain is much better - she has mild twinges of pain at random times but she walks and moved without pain.  The swelling has resolved.  Review of Systems  Musculoskeletal: Negative for joint swelling.    Objective:  BP 98/60 mmHg  Pulse 77  Temp(Src) 97.8 F (36.6 C) (Oral)  Resp 16  Ht 5\' 3"  (1.6 m)  Wt 142 lb 9.6 oz (64.683 kg)  BMI 25.27 kg/m2  SpO2 98%  LMP 05/19/2015  Physical Exam  Constitutional: She is oriented to person, place, and time and well-developed, well-nourished, and in no distress.  HENT:  Head: Normocephalic and atraumatic.  Right Ear: Hearing and external ear normal.  Left Ear: Hearing and external ear normal.  Eyes: Conjunctivae are normal.  Neck: Normal range of motion.  Pulmonary/Chest: Effort normal.  Musculoskeletal:       Right ankle: Normal. She exhibits no swelling. No tenderness.       Left ankle: Normal.  Neurological: She is alert and oriented to person, place, and time. Gait normal.  Skin: Skin is warm and dry.  Psychiatric: Mood, memory, affect and judgment normal.  Vitals reviewed.   Assessment and Plan :  Sprain of ankle, right, subsequent encounter  Pain in joint, ankle and foot, right  Improved - pt will only f/u if she has problems  Benny LennertSarah Dolphus Linch PA-C  [facility] 06/18/2015 1:15 PM

## 2015-06-18 NOTE — Patient Instructions (Signed)
     IF you received an x-ray today, you will receive an invoice from [facility]. Please contact [facility] at [phone] with questions or concerns regarding your invoice.   IF you received labwork today, you will receive an invoice from [facility]. Please contact [facility] at [phone] with questions or concerns regarding your invoice.   Our billing staff will not be able to assist you with questions regarding bills from these companies.  You will be contacted with the lab results as soon as they are available. The fastest way to get your results is to activate your My Chart account. Instructions are located on the last page of this paperwork. If you have not heard from us regarding the results in 2 weeks, please contact this office.      

## 2015-07-31 ENCOUNTER — Encounter: Payer: BLUE CROSS/BLUE SHIELD | Admitting: Internal Medicine

## 2015-08-01 ENCOUNTER — Telehealth: Payer: Self-pay

## 2015-08-01 NOTE — Telephone Encounter (Signed)
Patient was calling to request an earlier appointment for her physical. 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 Patient states she is traveling out of the country on 08-16-15 and really needs appointment prior to travels. Patient was informed that we do not have any available physical appointments with Dr.Carter prior to 08/16/15. Patient was informed that if she has an acute concern we can have her see another provider. Patient with no acute concerns and stressed that she needs her physical before 08/16/15.  Message will be forwarded to Dr.Carter and the NP Abbey Chatters(Jessica Eubanks) to advise

## 2015-08-01 NOTE — Telephone Encounter (Signed)
I do not see any openings that she can be squeezed into especially when I will be on vacation x 1 week. Is she willing to see Shanda BumpsJessica?

## 2015-08-01 NOTE — Telephone Encounter (Signed)
Patient is ok with seeing Shanda BumpsJessica. Shanda BumpsJessica please advise if its ok to schedule another providers patient for a CPX in an open CPX slot on your schedule

## 2015-08-01 NOTE — Telephone Encounter (Signed)
Appointment rescheduled from 08-16-15 (Dr.Carter) to 08-12-15 Linda Sloan(Jessica Eubanks,NP) @ 10:45 pm. I left message on voicemail informing patient of these changes. Patient to call if questions or concerns

## 2015-08-01 NOTE — Telephone Encounter (Signed)
This is ok

## 2015-08-12 ENCOUNTER — Encounter: Payer: Self-pay | Admitting: Nurse Practitioner

## 2015-08-12 ENCOUNTER — Ambulatory Visit (INDEPENDENT_AMBULATORY_CARE_PROVIDER_SITE_OTHER): Payer: BLUE CROSS/BLUE SHIELD | Admitting: Nurse Practitioner

## 2015-08-12 VITALS — BP 102/74 | HR 91 | Temp 97.6°F | Resp 19 | Ht 62.5 in | Wt 143.0 lb

## 2015-08-12 DIAGNOSIS — K5900 Constipation, unspecified: Secondary | ICD-10-CM

## 2015-08-12 DIAGNOSIS — R946 Abnormal results of thyroid function studies: Secondary | ICD-10-CM

## 2015-08-12 DIAGNOSIS — R7989 Other specified abnormal findings of blood chemistry: Secondary | ICD-10-CM

## 2015-08-12 DIAGNOSIS — G43109 Migraine with aura, not intractable, without status migrainosus: Secondary | ICD-10-CM | POA: Diagnosis not present

## 2015-08-12 DIAGNOSIS — Z0000 Encounter for general adult medical examination without abnormal findings: Secondary | ICD-10-CM | POA: Diagnosis not present

## 2015-08-12 DIAGNOSIS — Z124 Encounter for screening for malignant neoplasm of cervix: Secondary | ICD-10-CM

## 2015-08-12 LAB — TSH: TSH: 4.09 mIU/L

## 2015-08-12 MED ORDER — TOPIRAMATE 25 MG PO TABS: ORAL_TABLET | ORAL | 0 refills | Status: DC

## 2015-08-12 MED ORDER — TOPIRAMATE 25 MG PO TABS: 25.0000 mg | ORAL_TABLET | Freq: Every day | ORAL | Status: DC

## 2015-08-12 MED ORDER — ELETRIPTAN HYDROBROMIDE 20 MG PO TABS: 20.0000 mg | ORAL_TABLET | ORAL | 1 refills | Status: DC | PRN

## 2015-08-12 MED ORDER — DOCUSATE SODIUM 100 MG PO CAPS: 100.0000 mg | ORAL_CAPSULE | Freq: Two times a day (BID) | ORAL | 0 refills | Status: DC
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
## 2015-08-12 NOTE — Patient Instructions (Addendum)
STOP Imitrex Start Topamax 25 mg daily at bedtime, may increase to twice daily after 1 week if still having migraines  To use   Relpax 20 mg daily as needed at onset of symptoms and my repeat dose after 2 hours if need  Health Maintenance, Female Adopting a healthy lifestyle and getting preventive care can go a long way to promote health and wellness. Talk with your health care provider about what schedule of regular examinations is right for you. This is a good chance for you to check in with your provider about disease prevention and staying healthy. In between checkups, there are plenty of things you can do on your own. Experts have done a lot of research about which lifestyle changes and preventive measures are most likely to keep you healthy. Ask your health care provider for more information. WEIGHT AND DIET  Eat a healthy diet  Be sure to include plenty of vegetables, fruits, low-fat dairy products, and lean protein.  Do not eat a lot of foods high in solid fats, added sugars, or salt.  Get regular exercise. This is one of the most important things you can do for your health.  Most adults should exercise for at least 150 minutes each week. The exercise should increase your heart rate and make you sweat (moderate-intensity exercise).  Most adults should also do strengthening exercises at least twice a week. This is in addition to the moderate-intensity exercise.  Maintain a healthy weight  Body mass index (BMI) is a measurement that can be used to identify possible weight problems. It estimates body fat based on height and weight. Your health care provider can help determine your BMI and help you achieve or maintain a healthy weight.  For females 2 years of age and older:   A BMI below 18.5 is considered underweight.  A BMI of 18.5 to 24.9 is normal.  A BMI of 25 to 29.9 is considered overweight.  A BMI of 30 and above is considered obese.  Watch levels of cholesterol and  blood lipids  You should start having your blood tested for lipids and cholesterol at 53 years of age, then have this test every 5 years.  You may need to have your cholesterol levels checked more often if:  Your lipid or cholesterol levels are high.  You are older than 52 years of age.  You are at high risk for heart disease.  CANCER SCREENING   Lung Cancer  Lung cancer screening is recommended for adults 41-54 years old who are at high risk for lung cancer because of a history of smoking.  A yearly low-dose CT scan of the lungs is recommended for people who:  Currently smoke.  Have quit within the past 15 years.  Have at least a 30-pack-year history of smoking. A pack year is smoking an average of one pack of cigarettes a day for 1 year.  Yearly screening should continue until it has been 15 years since you quit.  Yearly screening should stop if you develop a health problem that would prevent you from having lung cancer treatment.  Breast Cancer  Practice breast self-awareness. This means understanding how your breasts normally appear and feel.  It also means doing regular breast self-exams. Let your health care provider know about any changes, no matter how small.  If you are in your 20s or 30s, you should have a clinical breast exam (CBE) by a health care provider every 1-3 years as part of a  regular health exam.  If you are 40 or older, have a CBE every year. Also consider having a breast X-ray (mammogram) every year.  If you have a family history of breast cancer, talk to your health care provider about genetic screening.  If you are at high risk for breast cancer, talk to your health care provider about having an MRI and a mammogram every year.  Breast cancer gene (BRCA) assessment is recommended for women who have family members with BRCA-related cancers. BRCA-related cancers include:  Breast.  Ovarian.  Tubal.  Peritoneal cancers.  Results of the  assessment will determine the need for genetic counseling and BRCA1 and BRCA2 testing. Cervical Cancer Your health care provider may recommend that you be screened regularly for cancer of the pelvic organs (ovaries, uterus, and vagina). This screening involves a pelvic examination, including checking for microscopic changes to the surface of your cervix (Pap test). You may be encouraged to have this screening done every 3 years, beginning at age 73.  For women ages 57-65, health care providers may recommend pelvic exams and Pap testing every 3 years, or they may recommend the Pap and pelvic exam, combined with testing for human papilloma virus (HPV), every 5 years. Some types of HPV increase your risk of cervical cancer. Testing for HPV may also be done on women of any age with unclear Pap test results.  Other health care providers may not recommend any screening for nonpregnant women who are considered low risk for pelvic cancer and who do not have symptoms. Ask your health care provider if a screening pelvic exam is right for you.  If you have had past treatment for cervical cancer or a condition that could lead to cancer, you need Pap tests and screening for cancer for at least 20 years after your treatment. If Pap tests have been discontinued, your risk factors (such as having a new sexual partner) need to be reassessed to determine if screening should resume. Some women have medical problems that increase the chance of getting cervical cancer. In these cases, your health care provider may recommend more frequent screening and Pap tests. Colorectal Cancer  This type of cancer can be detected and often prevented.  Routine colorectal cancer screening usually begins at 52 years of age and continues through 52 years of age.  Your health care provider may recommend screening at an earlier age if you have risk factors for colon cancer.  Your health care provider may also recommend using home test kits  to check for hidden blood in the stool.  A small camera at the end of a tube can be used to examine your colon directly (sigmoidoscopy or colonoscopy). This is done to check for the earliest forms of colorectal cancer.  Routine screening usually begins at age 32.  Direct examination of the colon should be repeated every 5-10 years through 52 years of age. However, you may need to be screened more often if early forms of precancerous polyps or small growths are found. Skin Cancer  Check your skin from head to toe regularly.  Tell your health care provider about any new moles or changes in moles, especially if there is a change in a mole's shape or color.  Also tell your health care provider if you have a mole that is larger than the size of a pencil eraser.  Always use sunscreen. Apply sunscreen liberally and repeatedly throughout the day.  Protect yourself by wearing long sleeves, pants, a wide-brimmed hat,  and sunglasses whenever you are outside. HEART DISEASE, DIABETES, AND HIGH BLOOD PRESSURE   High blood pressure causes heart disease and increases the risk of stroke. High blood pressure is more likely to develop in:  People who have blood pressure in the high end of the normal range (130-139/85-89 mm Hg).  People who are overweight or obese.  People who are African American.  If you are 23-79 years of age, have your blood pressure checked every 3-5 years. If you are 24 years of age or older, have your blood pressure checked every year. You should have your blood pressure measured twice--once when you are at a hospital or clinic, and once when you are not at a hospital or clinic. Record the average of the two measurements. To check your blood pressure when you are not at a hospital or clinic, you can use:  An automated blood pressure machine at a pharmacy.  A home blood pressure monitor.  If you are between 64 years and 35 years old, ask your health care provider if you should  take aspirin to prevent strokes.  Have regular diabetes screenings. This involves taking a blood sample to check your fasting blood sugar level.  If you are at a normal weight and have a low risk for diabetes, have this test once every three years after 52 years of age.  If you are overweight and have a high risk for diabetes, consider being tested at a younger age or more often. PREVENTING INFECTION  Hepatitis B  If you have a higher risk for hepatitis B, you should be screened for this virus. You are considered at high risk for hepatitis B if:  You were born in a country where hepatitis B is common. Ask your health care provider which countries are considered high risk.  Your parents were born in a high-risk country, and you have not been immunized against hepatitis B (hepatitis B vaccine).  You have HIV or AIDS.  You use needles to inject street drugs.  You live with someone who has hepatitis B.  You have had sex with someone who has hepatitis B.  You get hemodialysis treatment.  You take certain medicines for conditions, including cancer, organ transplantation, and autoimmune conditions. Hepatitis C  Blood testing is recommended for:  Everyone born from [birth year] through 1967.  Anyone with known risk factors for hepatitis C. Sexually transmitted infections (STIs)  You should be screened for sexually transmitted infections (STIs) including gonorrhea and chlamydia if:  You are sexually active and are younger than 52 years of age.  You are older than 52 years of age and your health care provider tells you that you are at risk for this type of infection.  Your sexual activity has changed since you were last screened and you are at an increased risk for chlamydia or gonorrhea. Ask your health care provider if you are at risk.  If you do not have HIV, but are at risk, it may be recommended that you take a prescription medicine daily to prevent HIV infection. This is called  pre-exposure prophylaxis (PrEP). You are considered at risk if:  You are sexually active and do not regularly use condoms or know the HIV status of your partner(s).  You take drugs by injection.  You are sexually active with a partner who has HIV. Talk with your health care provider about whether you are at high risk of being infected with HIV. If you choose to begin PrEP, you should  first be tested for HIV. You should then be tested every 3 months for as long as you are taking PrEP.  PREGNANCY   If you are premenopausal and you may become pregnant, ask your health care provider about preconception counseling.  If you may become pregnant, take 400 to 800 micrograms (mcg) of folic acid every day.  If you want to prevent pregnancy, talk to your health care provider about birth control (contraception). OSTEOPOROSIS AND MENOPAUSE   Osteoporosis is a disease in which the bones lose minerals and strength with aging. This can result in serious bone fractures. Your risk for osteoporosis can be identified using a bone density scan.  If you are 2 years of age or older, or if you are at risk for osteoporosis and fractures, ask your health care provider if you should be screened.  Ask your health care provider whether you should take a calcium or vitamin D supplement to lower your risk for osteoporosis.  Menopause may have certain physical symptoms and risks.  Hormone replacement therapy may reduce some of these symptoms and risks. Talk to your health care provider about whether hormone replacement therapy is right for you.  HOME CARE INSTRUCTIONS   Schedule regular health, dental, and eye exams.  Stay current with your immunizations.   Do not use any tobacco products including cigarettes, chewing tobacco, or electronic cigarettes.  If you are pregnant, do not drink alcohol.  If you are breastfeeding, limit how much and how often you drink alcohol.  Limit alcohol intake to no more than 1  drink per day for nonpregnant women. One drink equals 12 ounces of beer, 5 ounces of wine, or 1 ounces of hard liquor.  Do not use street drugs.  Do not share needles.  Ask your health care provider for help if you need support or information about quitting drugs.  Tell your health care provider if you often feel depressed.  Tell your health care provider if you have ever been abused or do not feel safe at home.   This information is not intended to replace advice given to you by your health care provider. Make sure you discuss any questions you have with your health care provider.   Document Released: 07/21/2010 Document Revised: 01/26/2014 Document Reviewed: 12/07/2012 Elsevier Interactive Patient Education [employer].

## 2015-08-13 LAB — PAP IG (IMAGE GUIDED)

## 2015-08-16 ENCOUNTER — Encounter: Payer: BLUE CROSS/BLUE SHIELD | Admitting: Internal Medicine

## 2015-08-19 ENCOUNTER — Telehealth: Payer: Self-pay

## 2015-08-19 NOTE — Telephone Encounter (Signed)
-----   Message from Sharon Seller, NP sent at 08/15/2015  9:08 PM EDT ----- Regarding: follow up  Pt needs to schedule a follow up appt in 4 weeks with Dr Montez Morita or myself to follow up migraines

## 2015-08-19 NOTE — Telephone Encounter (Signed)
I called patient to schedule a 4 week follow up appointment with either Dr. Montez Morita or Shanda Bumps for migraines. I was informed that patient is currently out of the country and will not be back until around August 8-9.

## 2015-09-03 ENCOUNTER — Telehealth: Payer: Self-pay

## 2015-09-03 NOTE — Telephone Encounter (Signed)
I called patient to see about setting up a follow up appointment (toward end of August) with either Shanda BumpsJessica or Dr. Montez Moritaarter for migraines.   I initially called patient on 08/19/15 to try to set up this appointment but I was told that patient was out of the country until August 8.   I left a message for patient to call the office about scheduling follow up.

## 2015-10-08 ENCOUNTER — Telehealth: Payer: Self-pay

## 2015-10-08 NOTE — Telephone Encounter (Signed)
I have called patient several times attempting to schedule a follow up appointment for migraines, however, I have been unable to reach patient.   I left a message for patient to call the office to discuss setting up a follow up appointment.   Patient was seen on 08/12/15 for wellness exam in which Shanda BumpsJessica addressed patient's frequent migraines and patient was started on Topamax daily. Shanda BumpsJessica wanted to have patient follow up in 4 weeks.

## 2015-11-27 ENCOUNTER — Encounter: Payer: Self-pay | Admitting: Internal Medicine

## 2015-11-27 ENCOUNTER — Ambulatory Visit (INDEPENDENT_AMBULATORY_CARE_PROVIDER_SITE_OTHER): Payer: BLUE CROSS/BLUE SHIELD | Admitting: Internal Medicine

## 2015-11-27 VITALS — BP 102/80 | HR 71 | Temp 98.0°F | Ht 62.5 in | Wt 145.8 lb

## 2015-11-27 DIAGNOSIS — M21611 Bunion of right foot: Secondary | ICD-10-CM

## 2015-11-27 DIAGNOSIS — Z23 Encounter for immunization: Secondary | ICD-10-CM | POA: Diagnosis not present

## 2015-11-27 DIAGNOSIS — R232 Flushing: Secondary | ICD-10-CM

## 2015-11-27 DIAGNOSIS — R1011 Right upper quadrant pain: Secondary | ICD-10-CM | POA: Diagnosis not present

## 2015-11-27 DIAGNOSIS — M62838 Other muscle spasm: Secondary | ICD-10-CM

## 2015-11-27 DIAGNOSIS — G43109 Migraine with aura, not intractable, without status migrainosus: Secondary | ICD-10-CM

## 2015-11-27 LAB — COMPLETE METABOLIC PANEL WITH GFR
ALT: 17 U/L (ref 6–29)
AST: 30 U/L (ref 10–35)
Albumin: 4.1 g/dL (ref 3.6–5.1)
Alkaline Phosphatase: 70 U/L (ref 33–130)
BILIRUBIN TOTAL: 0.5 mg/dL (ref 0.2–1.2)
BUN: 10 mg/dL (ref 7–25)
CHLORIDE: 104 mmol/L (ref 98–110)
CO2: 24 mmol/L (ref 20–31)
Calcium: 9.3 mg/dL (ref 8.6–10.4)
Creat: 0.73 mg/dL (ref 0.50–1.05)
GFR, Est African American: >89 mL/min (ref >=60)
GLUCOSE: 88 mg/dL (ref 65–99)
Potassium: 4 mmol/L (ref 3.5–5.3)
SODIUM: 140 mmol/L (ref 135–146)
TOTAL PROTEIN: 6.9 g/dL (ref 6.1–8.1)

## 2015-11-27 MED ORDER — TOPIRAMATE 25 MG PO TABS: 25.0000 mg | ORAL_TABLET | Freq: Every day | ORAL | 3 refills | Status: AC

## 2015-11-27 NOTE — Progress Notes (Signed)
Patient ID: Geraldyne Barraclough, female   DOB: February 16, 1965, 50 y.o.   MRN: [id number]    Location:  [facility] Place of Service: OFFICE  Chief Complaint  Patient presents with  . Acute Visit    Headaches  . Medication Management    patient states she is unable to function with previous meds     HPI:  50 yo female seen today for HA. She was started on relpax prn and topamax for migraines in July 2017. She did not try medications right away and waited until she returned from [place] trip. When she did start medications, she began to shake and had severe fatigue. She stopped them and is unable to tell me which one caused what side effect.   She has RUQ pain, sharp and stabbing at times. She has hx diverticulitis that req'd sx. She has seen GI Dr Shana Chute in the past  She is c/a weight gain and reduced appetite  She has vasomotor sx's - hot flashes    Past Medical History:  Diagnosis Date  . Allergy   . Arthritis   . Asthma   . History of abdominal pain    Abdominal tenderness, LLQ  . History of diverticulitis of colon    Resolved 01/2012    Past Surgical History:  Procedure Laterality Date  . CHOLECYSTECTOMY  1998  . HERNIA REPAIR  2014   [place]   Marrian Salvage, MD  . LAPAROSCOPY  2012   Wynona Neat. Shana Chute, MD  . Lonell Face N/A 2013   Fiberoptic Heron Nay, Horace      Patient Care Team: Gildardo Cranker, DO as PCP - General (Internal Medicine)  Social History   Social History  . Marital status: Married    Spouse name: N/A  . Number of children: N/A  . Years of education: N/A   Occupational History  . Not on file.   Social History Main Topics  . Smoking status: Never Smoker  . Smokeless tobacco: Never Used  . Alcohol use No  . Drug use: No  . Sexual activity: Not on file   Other Topics Concern  . Not on file   Social History Narrative  . No narrative on file     reports that she has never smoked. She has never used smokeless tobacco. She reports that  she does not drink alcohol or use drugs.  Family History  Problem Relation Age of Onset  . Stroke Mother 22  . Diabetes Mother 42  . Arthritis Mother 38  . Heart disease Mother   . Hyperlipidemia Mother   . Hypertension Mother   . Heart disease Father 81    heart attack  . Stroke Father 63  . Cancer Brother 19  . Hyperlipidemia Sister    Family Status  Relation Status  . Mother Deceased at age 42  . Father Deceased at age 22  . Brother Deceased  . Sister Alive  . Brother Deceased     Allergies  Allergen Reactions  . Flagyl [Metronidazole] Shortness Of Breath  . Clindamycin/Lincomycin     Medications: Patient's Medications  New Prescriptions   TOPIRAMATE (TOPAMAX) 25 MG TABLET    Take 1 tablet (25 mg total) by mouth at bedtime.  Previous Medications   POLYETHYLENE GLYCOL (MIRALAX / GLYCOLAX) PACKET    Take 17 g by mouth daily.  Modified Medications   No medications on file  Discontinued Medications   DOCUSATE SODIUM (COLACE) 100 MG CAPSULE    Take  1 capsule (100 mg total) by mouth 2 (two) times daily.   ELETRIPTAN (RELPAX) 20 MG TABLET    Take 1 tablet (20 mg total) by mouth as needed for migraine or headache. May repeat in 2 hours if headache persists or recurs.   TOPIRAMATE (TOPAMAX) 25 MG TABLET    Take 1 tablet by mouth at bedtime, may increase to twice daily after 2 weeks for migraines    Review of Systems  Constitutional: Positive for appetite change and unexpected weight change.  HENT: Positive for ear pain.   Gastrointestinal: Positive for abdominal pain.  Musculoskeletal: Positive for back pain and neck pain.  Neurological: Positive for headaches.  All other systems reviewed and are negative.   Vitals:   11/27/15 0915  BP: 102/80  Pulse: 71  Temp: 98 F (36.7 C)  TempSrc: Oral  SpO2: 99%  Weight: 145 lb 12.8 oz (66.1 kg)  Height: 5' 2.5" (1.588 m)   Body mass index is 26.24 kg/m.  Physical Exam  Constitutional: She is oriented to person,  place, and time. She appears well-developed and well-nourished.  HENT:  Mouth/Throat: Oropharynx is clear and moist. No oropharyngeal exudate.  TMS appear nml  Eyes: Pupils are equal, round, and reactive to light. No scleral icterus.  Neck: Neck supple. Muscular tenderness present. Carotid bruit is not present. Decreased range of motion present. No tracheal deviation present. No thyromegaly present.    Cardiovascular: Normal rate, regular rhythm, normal heart sounds and intact distal pulses.  Exam reveals no gallop and no friction rub.   No murmur heard. No LE edema b/l. no calf TTP.   Pulmonary/Chest: Effort normal and breath sounds normal. No stridor. No respiratory distress. She has no wheezes. She has no rales.  Abdominal: Soft. Bowel sounds are normal. She exhibits no distension and no mass. There is no hepatomegaly. There is tenderness in the right upper quadrant. There is positive Murphy's sign. There is no rebound and no guarding.  Musculoskeletal: She exhibits deformity (right TTP bunion on foot. no callus formation).  Lymphadenopathy:    She has no cervical adenopathy.  Neurological: She is alert and oriented to person, place, and time. She has normal reflexes.  Skin: Skin is warm and dry. No rash noted.  Psychiatric: She has a normal mood and affect. Her behavior is normal. Judgment and thought content normal.     Labs reviewed: Office Visit on 11/27/2015  Component Date Value Ref Range Status  . Sodium 11/27/2015 140  135 - 146 mmol/L Final  . Potassium 11/27/2015 4.0  3.5 - 5.3 mmol/L Final  . Chloride 11/27/2015 104  98 - 110 mmol/L Final  . CO2 11/27/2015 24  20 - 31 mmol/L Final  . Glucose, Bld 11/27/2015 88  65 - 99 mg/dL Final  . BUN 11/27/2015 10  7 - 25 mg/dL Final  . Creat 11/27/2015 0.73  0.50 - 1.05 mg/dL Final   Comment:   For patients > or = 52 years of age: The upper reference limit for Creatinine is approximately 13% higher for people identified  as African-American.     . Total Bilirubin 11/27/2015 0.5  0.2 - 1.2 mg/dL Final  . Alkaline Phosphatase 11/27/2015 70  33 - 130 U/L Final  . AST 11/27/2015 30  10 - 35 U/L Final  . ALT 11/27/2015 17  6 - 29 U/L Final  . Total Protein 11/27/2015 6.9  6.1 - 8.1 g/dL Final  . Albumin 11/27/2015 4.1  3.6 - 5.1  g/dL Final  . Calcium 11/27/2015 9.3  8.6 - 10.4 mg/dL Final  . GFR, Est African American 11/27/2015 >89  >=60 mL/min Final  . GFR, Est Non African American 11/27/2015 >89  >=60 mL/min Final  . FSH 11/28/2015 93.7  mIU/mL Final   Comment:   Reference Range Female >=19 years of age: Follicular Phase 6.1-95.0 Mid-Cycle Peak   3.1-17.7 Luteal Phase     1.5-9.1 Postmenopausal   23.0-116.3       No results found.   Assessment/Plan   ICD-9-CM ICD-10-CM   1. Migraine with aura and without status migrainosus, not intractable 346.00 G43.109 Ambulatory referral to Neurology     topiramate (TOPAMAX) 25 MG tablet  2. Bunion, right 727.1 M21.611   3. Muscle spasm 728.85 M62.838   4. RUQ abdominal pain 789.01 R10.11 US Abdomen Complete     CMP with eGFR  5. Hot flashes 782.62 R23.2 Estradiol     Follicle Stimulating Hormone  6. Encounter for immunization Z23 Z23 Flu Vaccine QUAD 36+ mos IM   Take topamax on a daily basis for maintenance therapy to treat headache. Will discuss addition of triptan at next OV  T/c fioricet if unable to tolerate topamax and/or triptan  Will call with neurology appt  Will call with lab results and imaging appt  Flu shot given today  Observation for bunion. She declines further w/u at this time  Follow up in 1 month for headache  Zaraya Delauder S. Perlie Gold  [facility] [address] [place], [place] [address] [phone] Cell (Monday-Friday 8 AM - 5 PM) [phone] After 5 PM and follow prompts

## 2015-11-27 NOTE — Patient Instructions (Addendum)
Take topamax on a daily basis for maintenance therapy to treat headache  Will call with neurology appt  Will call with lab results and imaging appt  Flu shot given today  Observation for bunion  Follow up in 1 month for headache    Bunion (Hallux Valgus) A bony bump (protrusion) on the inside of the foot, at the base of the first toe, is called a bunion (hallux valgus). A bunion causes the first toe to angle toward the other toes. SYMPTOMS   A bony bump on the inside of the foot, causing an outward turning of the first toe. It may also overlap the second toe.  Thickening of the skin (callus) over the bony bump.  Fluid buildup under the callus. Fluid may become red, tender, and swollen (inflamed) with constant irritation or pressure.  Foot pain and stiffness. CAUSES  Many causes exist, including:  Inherited from your family (genetics).  Injury (trauma) forcing the first toe into a position in which it overlaps other toes.  Bunions are also associated with wearing shoes that have a narrow toe box (pointy shoes). RISK INCREASES WITH:  Family history of foot abnormalities, especially bunions.  Arthritis.  Narrow shoes, especially high heels. PREVENTION  Wear shoes with a wide toe box.  Avoid shoes with high heels.  Wear a small pad between the big toe and second toe.  Maintain proper conditioning:  Foot and ankle flexibility.  Muscle strength and endurance. PROGNOSIS  With proper treatment, bunions can typically be cured. Occasionally, surgery is required.  RELATED COMPLICATIONS   Infection of the bunion.  Arthritis of the first toe.  Risks of surgery, including infection, bleeding, injury to nerves (numb toe), recurrent bunion, overcorrection (toe points inward), arthritis of the big toe, big toe pointing upward, and bone not healing. TREATMENT  Treatment first consists of stopping the activities that aggravate the pain, taking pain medicines, and icing to  reduce inflammation and pain. Wear shoes with a wide toe box. Shoes can be modified by a shoe repair person to relieve pressure on the bunion, especially if you cannot find shoes with a wide enough toe box. You may also place a pad with the center cut out in your shoe, to reduce pressure on the bunion. Sometimes, an arch support (orthotic) may reduce pressure on the bunion and alleviate the symptoms. Stretching and strengthening exercises for the muscles of the foot may be useful. You may choose to wear a brace or pad at night to hold the big toe away from the second toe. If non-surgical treatments are not successful, surgery may be needed. Surgery involves removing the overgrown tissue and correcting the position of the first toe, by realigning the bones. Bunion surgery is typically performed on an outpatient basis, meaning you can go home the same day as surgery. The surgery may involve cutting the mid portion of the bone of the first toe, or just cutting and repairing (reconstructing) the ligaments and soft tissues around the first toe.  MEDICATION   If pain medicine is needed, nonsteroidal anti-inflammatory medicines, such as aspirin and ibuprofen, or other minor pain relievers, such as acetaminophen, are often recommended.  Do not take pain medicine for 7 days before surgery.  Prescription pain relievers are usually only prescribed after surgery. Use only as directed and only as much as you need.  Ointments applied to the skin may be helpful. HEAT AND COLD  Cold treatment (icing) relieves pain and reduces inflammation. Cold treatment should be applied  for 10 to 15 minutes every 2 to 3 hours for inflammation and pain and immediately after any activity that aggravates your symptoms. Use ice packs or an ice massage.  Heat treatment may be used prior to performing the stretching and strengthening activities prescribed by your caregiver, physical therapist, or athletic trainer. Use a heat pack or a  warm soak. SEEK MEDICAL CARE IF:   Symptoms get worse or do not improve in 2 weeks, despite treatment.  After surgery, you develop fever, increasing pain, redness, swelling, drainage of fluids, bleeding, or increasing warmth around the surgical area.  New, unexplained symptoms develop. (Drugs used in treatment may produce side effects.)   This information is not intended to replace advice given to you by your health care provider. Make sure you discuss any questions you have with your health care provider.   Document Released: 01/05/2005 Document Revised: 03/30/2011 Document Reviewed: 04/19/2008 Elsevier Interactive Patient Education 2016 [employer]  Migraine Headache A migraine headache is very bad, throbbing pain on one or both sides of your head. Talk to your doctor about what things may bring on (trigger) your migraine headaches. HOME CARE  Only take medicines as told by your doctor.  Lie down in a dark, quiet room when you have a migraine.  Keep a journal to find out if certain things bring on migraine headaches. For example, write down:  What you eat and drink.  How much sleep you get.  Any change to your diet or medicines.  Lessen how much alcohol you drink.  Quit smoking if you smoke.  Get enough sleep.  Lessen any stress in your life.  Keep lights dim if bright lights bother you or make your migraines worse. GET HELP RIGHT AWAY IF:   Your migraine becomes really bad.  You have a fever.  You have a stiff neck.  You have trouble seeing.  Your muscles are weak, or you lose muscle control.  You lose your balance or have trouble walking.  You feel like you will pass out (faint), or you pass out.  You have really bad symptoms that are different than your first symptoms. MAKE SURE YOU:   Understand these instructions.  Will watch your condition.  Will get help right away if you are not doing well or get worse.   This information is not intended to  replace advice given to you by your health care provider. Make sure you discuss any questions you have with your health care provider.   Document Released: 10/15/2007 Document Revised: 03/30/2011 Document Reviewed: 09/12/2012 Elsevier Interactive Patient Education [employer] Inc.

## 2015-11-28 DIAGNOSIS — M62838 Other muscle spasm: Secondary | ICD-10-CM | POA: Insufficient documentation

## 2015-11-28 DIAGNOSIS — R232 Flushing: Secondary | ICD-10-CM | POA: Insufficient documentation

## 2015-11-28 DIAGNOSIS — M21611 Bunion of right foot: Secondary | ICD-10-CM | POA: Insufficient documentation

## 2015-11-28 LAB — FOLLICLE STIMULATING HORMONE: FSH: 93.7 m[IU]/mL

## 2015-12-04 LAB — ESTRADIOL, FREE
ESTRADIOL: 8 pg/mL
Estradiol, Free: 0.1 pg/mL

## 2015-12-11 ENCOUNTER — Other Ambulatory Visit: Payer: Self-pay

## 2015-12-11 MED ORDER — CONJ ESTROG-MEDROXYPROGEST ACE 0.3-1.5 MG PO TABS: 1.0000 | ORAL_TABLET | Freq: Every day | ORAL | 6 refills | Status: DC

## 2015-12-11 NOTE — Telephone Encounter (Signed)
Spoke with patient informed her that I sent meds to pharmacy.

## 2016-01-01 ENCOUNTER — Ambulatory Visit: Payer: Self-pay | Admitting: Internal Medicine

## 2016-04-12 ENCOUNTER — Inpatient Hospital Stay (HOSPITAL_BASED_OUTPATIENT_CLINIC_OR_DEPARTMENT_OTHER)
Admission: EM | Admit: 2016-04-12 | Discharge: 2016-04-14 | DRG: 392 | Disposition: A | Discharge status: Home / Self Care | Payer: BLUE CROSS/BLUE SHIELD | Attending: Internal Medicine | Admitting: Internal Medicine

## 2016-04-12 ENCOUNTER — Encounter (HOSPITAL_BASED_OUTPATIENT_CLINIC_OR_DEPARTMENT_OTHER): Payer: Self-pay | Admitting: Emergency Medicine

## 2016-04-12 ENCOUNTER — Emergency Department (HOSPITAL_BASED_OUTPATIENT_CLINIC_OR_DEPARTMENT_OTHER): Payer: BLUE CROSS/BLUE SHIELD

## 2016-04-12 DIAGNOSIS — T508X5A Adverse effect of diagnostic agents, initial encounter: Secondary | ICD-10-CM | POA: Diagnosis not present

## 2016-04-12 DIAGNOSIS — T782XXA Anaphylactic shock, unspecified, initial encounter: Secondary | ICD-10-CM | POA: Diagnosis present

## 2016-04-12 DIAGNOSIS — I10 Essential (primary) hypertension: Secondary | ICD-10-CM | POA: Diagnosis present

## 2016-04-12 DIAGNOSIS — Z881 Allergy status to other antibiotic agents status: Secondary | ICD-10-CM

## 2016-04-12 DIAGNOSIS — K5732 Diverticulitis of large intestine without perforation or abscess without bleeding: Principal | ICD-10-CM | POA: Diagnosis present

## 2016-04-12 DIAGNOSIS — J45909 Unspecified asthma, uncomplicated: Secondary | ICD-10-CM | POA: Diagnosis present

## 2016-04-12 DIAGNOSIS — Z9049 Acquired absence of other specified parts of digestive tract: Secondary | ICD-10-CM

## 2016-04-12 DIAGNOSIS — Z888 Allergy status to other drugs, medicaments and biological substances status: Secondary | ICD-10-CM

## 2016-04-12 DIAGNOSIS — K5792 Diverticulitis of intestine, part unspecified, without perforation or abscess without bleeding: Secondary | ICD-10-CM | POA: Diagnosis present

## 2016-04-12 DIAGNOSIS — Y92239 Unspecified place in hospital as the place of occurrence of the external cause: Secondary | ICD-10-CM | POA: Diagnosis not present

## 2016-04-12 DIAGNOSIS — M1990 Unspecified osteoarthritis, unspecified site: Secondary | ICD-10-CM | POA: Diagnosis present

## 2016-04-12 DIAGNOSIS — T886XXA Anaphylactic reaction due to adverse effect of correct drug or medicament properly administered, initial encounter: Secondary | ICD-10-CM | POA: Diagnosis not present

## 2016-04-12 DIAGNOSIS — Z91041 Radiographic dye allergy status: Secondary | ICD-10-CM

## 2016-04-12 DIAGNOSIS — E860 Dehydration: Secondary | ICD-10-CM | POA: Diagnosis present

## 2016-04-12 DIAGNOSIS — Y848 Other medical procedures as the cause of abnormal reaction of the patient, or of later complication, without mention of misadventure at the time of the procedure: Secondary | ICD-10-CM | POA: Diagnosis not present

## 2016-04-12 HISTORY — DX: Essential (primary) hypertension: I10

## 2016-04-12 LAB — CBC
HCT: 44.3 % (ref 36.0–46.0)
HEMOGLOBIN: 15.5 g/dL — AB (ref 12.0–15.0)
MCH: 29.6 pg (ref 26.0–34.0)
MCHC: 35 g/dL (ref 30.0–36.0)
MCV: 84.5 fL (ref 78.0–100.0)
Platelets: 269 10*3/uL (ref 150–400)
RBC: 5.24 MIL/uL — ABNORMAL HIGH (ref 3.87–5.11)
RDW: 12.3 % (ref 11.5–15.5)
WBC: 10.3 10*3/uL (ref 4.0–10.5)

## 2016-04-12 LAB — COMPREHENSIVE METABOLIC PANEL
ALBUMIN: 4 g/dL (ref 3.5–5.0)
ALK PHOS: 90 U/L (ref 38–126)
ALT: 21 U/L (ref 14–54)
ANION GAP: 9 (ref 5–15)
AST: 33 U/L (ref 15–41)
BILIRUBIN TOTAL: 0.8 mg/dL (ref 0.3–1.2)
BUN: 10 mg/dL (ref 6–20)
CALCIUM: 9.3 mg/dL (ref 8.9–10.3)
CO2: 23 mmol/L (ref 22–32)
Chloride: 105 mmol/L (ref 101–111)
Creatinine, Ser: 0.72 mg/dL (ref 0.44–1.00)
GFR calc Af Amer: >60 mL/min (ref >60)
GFR calc non Af Amer: >60 mL/min (ref >60)
GLUCOSE: 109 mg/dL — AB (ref 65–99)
Potassium: 3.6 mmol/L (ref 3.5–5.1)
SODIUM: 137 mmol/L (ref 135–145)
TOTAL PROTEIN: 7.6 g/dL (ref 6.5–8.1)

## 2016-04-12 LAB — URINALYSIS, ROUTINE W REFLEX MICROSCOPIC
BILIRUBIN URINE: NEGATIVE
Glucose, UA: NEGATIVE mg/dL
HGB URINE DIPSTICK: NEGATIVE
KETONES UR: NEGATIVE mg/dL
Leukocytes, UA: NEGATIVE
Nitrite: NEGATIVE
PH: 7 (ref 5.0–8.0)
Protein, ur: NEGATIVE mg/dL
SPECIFIC GRAVITY, URINE: 1.003 — AB (ref 1.005–1.030)

## 2016-04-12 LAB — LIPASE, BLOOD: Lipase: 27 U/L (ref 11–51)

## 2016-04-12 MED ORDER — DIPHENHYDRAMINE HCL 50 MG/ML IJ SOLN
INTRAMUSCULAR | Status: AC
  Administered 2016-04-12: 25 mg via INTRAVENOUS
  Filled 2016-04-12: qty 1

## 2016-04-12 MED ORDER — MORPHINE SULFATE (PF) 4 MG/ML IV SOLN
4.0000 mg | Freq: Once | INTRAVENOUS | Status: AC
Start: 2016-04-12 — End: 2016-04-12
  Administered 2016-04-12: 4 mg via INTRAVENOUS
  Filled 2016-04-12: qty 1

## 2016-04-12 MED ORDER — GI COCKTAIL ~~LOC~~
30.0000 mL | Freq: Once | ORAL | Status: AC
  Administered 2016-04-12: 30 mL via ORAL
  Filled 2016-04-12: qty 30

## 2016-04-12 MED ORDER — SODIUM CHLORIDE 0.9 % IV BOLUS (SEPSIS)
1000.0000 mL | Freq: Once | INTRAVENOUS | Status: AC
  Administered 2016-04-12: 1000 mL via INTRAVENOUS

## 2016-04-12 MED ORDER — FAMOTIDINE IN NACL 20-0.9 MG/50ML-% IV SOLN
20.0000 mg | Freq: Once | INTRAVENOUS | Status: AC
  Administered 2016-04-12: 20 mg via INTRAVENOUS
  Filled 2016-04-12: qty 50

## 2016-04-12 MED ORDER — ONDANSETRON HCL 4 MG/2ML IJ SOLN
4.0000 mg | Freq: Once | INTRAMUSCULAR | Status: AC | PRN
  Administered 2016-04-12: 4 mg via INTRAVENOUS
  Filled 2016-04-12: qty 2

## 2016-04-12 MED ORDER — METHYLPREDNISOLONE SODIUM SUCC 125 MG IJ SOLR
125.0000 mg | Freq: Once | INTRAMUSCULAR | Status: AC
  Administered 2016-04-12: 125 mg via INTRAVENOUS
  Filled 2016-04-12: qty 2

## 2016-04-12 MED ORDER — IOPAMIDOL (ISOVUE-300) INJECTION 61%
100.0000 mL | Freq: Once | INTRAVENOUS | Status: AC | PRN
  Administered 2016-04-12: 100 mL via INTRAVENOUS

## 2016-04-12 MED ORDER — ONDANSETRON HCL 4 MG/2ML IJ SOLN
4.0000 mg | Freq: Once | INTRAMUSCULAR | Status: AC
  Administered 2016-04-12: 4 mg via INTRAVENOUS
  Filled 2016-04-12: qty 2

## 2016-04-12 MED ORDER — SODIUM CHLORIDE 0.9 % IV SOLN
3.0000 g | Freq: Once | INTRAVENOUS | Status: AC
  Administered 2016-04-12: 3 g via INTRAVENOUS
  Filled 2016-04-12: qty 3

## 2016-04-12 MED ORDER — DIPHENHYDRAMINE HCL 50 MG/ML IJ SOLN
25.0000 mg | Freq: Once | INTRAMUSCULAR | Status: AC
  Administered 2016-04-12: 25 mg via INTRAVENOUS

## 2016-04-12 NOTE — ED Notes (Signed)
Returns from CT with hives and itching noted on face, EDP and EDPA aware[facility] urticaria noted, c/o itching, denies nausea or sob. Alert, NAD, calm, interactive. [facility] No dyspnea noted. VSS.

## 2016-04-12 NOTE — ED Notes (Signed)
ED Provider at bedside. 

## 2016-04-12 NOTE — ED Notes (Addendum)
EDPA into room, pt c/o of abd pain increasing, restless. Denies nausea. VSS.

## 2016-04-12 NOTE — ED Notes (Signed)
Alert, sleepy, NAD, calm, interactive, resps e/u, speaking in clear complete sentences, no dyspnea noted, skin W&D, VSS, c/o abd pain, also mentions some dizziness and blurry vision, (denies: sob, nausea). [facility] contrast to begin.

## 2016-04-12 NOTE — ED Notes (Signed)
Resting comfortably, no pain, dyspnea or itching noted, hives lessened, VSS, [facility].

## 2016-04-12 NOTE — ED Provider Notes (Signed)
[facility] Provider Note   CSN: [id number] Arrival date & time: 04/12/16  2013  By signing my name below, I, Nelwyn Salisbury, attest that this documentation has been prepared under the direction and in the presence of non-physician practitioner, Rise Mu, PA-C. Electronically Signed: Nelwyn Salisbury, Scribe. 04/12/2016. 9:01 PM.  History   Chief Complaint Chief Complaint  Patient presents with  . Abdominal Pain   The history is provided by the patient. No language interpreter was used.    HPI Comments:  Linda Sloan is a 51 y.o. female who presents to the Emergency Department complaining of constant, unchanged lower abdominal pain onset yesterday. Pt describes her pain as a 9/10 pain exacerbated by palpation. She reports associated dysuria, nausea, fever, back pain and left flank pain. Pt has not taken anything for her symptoms PTA. She denies any vaginal bleeding, vaginal discharge, bloody stools or vomiting. Reports last bowel movement this morning that was normal.  Past Medical History:  Diagnosis Date  . Allergy   . Arthritis   . Asthma   . History of abdominal pain    Abdominal tenderness, LLQ  . History of diverticulitis of colon    Resolved 01/2012  . Hypertension     Patient Active Problem List   Diagnosis Date Noted  . Bunion, right 11/28/2015  . Muscle spasm 11/28/2015  . Hot flashes 11/28/2015  . Wellness examination 08/12/2015  . Migraine 12/19/2013  . Asthma, chronic 12/19/2013  . 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 CN (constipation) 12/19/2013    Past Surgical History:  Procedure Laterality Date  . APPENDECTOMY    . CHOLECYSTECTOMY  1998  . HERNIA REPAIR  2014   Lipoma   Michell Heinrich, MD  . LAPAROSCOPY  2012   Kathrynn Running. Lanae Boast, MD  . Daiva Huge N/A 2013   Fiberoptic Olin Pia, MD  . TUBAL LIGATION      OB History    No data available       Home Medications    Prior to Admission  medications   Medication Sig Start Date End Date Taking? Authorizing Provider  metoprolol succinate (TOPROL-XL) 25 MG 24 hr tablet Take 25 mg by mouth daily.   Yes Historical Provider, MD  traMADol (ULTRAM) 50 MG tablet Take 50 mg by mouth every 6 (six) hours as needed.   Yes Historical Provider, MD  estrogen, conjugated,-medroxyprogesterone (PREMPRO) 0.3-1.5 MG tablet Take 1 tablet by mouth daily. 12/11/15   Kirt Boys, DO  polyethylene glycol (MIRALAX / GLYCOLAX) packet Take 17 g by mouth daily.    Historical Provider, MD  topiramate (TOPAMAX) 25 MG tablet Take 1 tablet (25 mg total) by mouth at bedtime. 11/27/15   Kirt Boys, DO    Family History Family History  Problem Relation Age of Onset  . Stroke Mother 32  . Diabetes Mother 49  . Arthritis Mother 42  . Heart disease Mother   . Hyperlipidemia Mother   . Hypertension Mother   . Heart disease Father 40    heart attack  . Stroke Father 63  . Cancer Brother 51  . Hyperlipidemia Sister     Social History Social History  Substance Use Topics  . Smoking status: Never Smoker  . Smokeless tobacco: Never Used  . Alcohol use No     Allergies   Flagyl [metronidazole]; Sumatriptan; and Clindamycin/lincomycin   Review of Systems Review of Systems  Constitutional: Positive for fever.  Gastrointestinal: Positive for abdominal pain  and nausea. Negative for blood in stool.  Genitourinary: Positive for dysuria and flank pain. Negative for vaginal bleeding and vaginal discharge.  Musculoskeletal: Positive for back pain.  All other systems reviewed and are negative.    Physical Exam Updated Vital Signs BP 106/76   Pulse 85   Temp 99.7 F (37.6 C) (Oral)   Resp 12   Ht 5\' 2"  (1.575 m)   Wt 64 kg   LMP 05/20/2015 (Approximate)   SpO2 99%   BMI 25.79 kg/m   Physical Exam  Constitutional: She is oriented to person, place, and time. She appears well-developed and well-nourished. No distress.  Non toxic appearing but  appears uncomfortable due to pain.   HENT:  Head: Normocephalic and atraumatic.  Eyes: Conjunctivae are normal.  Neck: Normal range of motion. Neck supple.  Cardiovascular: Regular rhythm, normal heart sounds and intact distal pulses.   tachycardia  Pulmonary/Chest: Effort normal and breath sounds normal.  Abdominal: Soft. Bowel sounds are normal. She exhibits no distension. There is tenderness. There is no rebound and no guarding.  Suprapubic, left lower and right lower quadrant tenderness to palpation. No rigidity rebound or guarding. Left CVA tenderness.   Musculoskeletal: Normal range of motion.  Neurological: She is alert and oriented to person, place, and time.  Skin: Skin is warm and dry. Capillary refill takes less than 2 seconds.  Psychiatric: She has a normal mood and affect.  Nursing note and vitals reviewed.  ED Treatments / Results  DIAGNOSTIC STUDIES:  Oxygen Saturation is 100% on RA, normal by my interpretation.    COORDINATION OF CARE:  9:06 PM Discussed treatment plan with pt at bedside which includes blood work and urinalysis and pt agreed to plan.  Labs (all labs ordered are listed, but only abnormal results are displayed) Labs Reviewed  COMPREHENSIVE METABOLIC PANEL - Abnormal; Notable for the following:       Result Value   Glucose, Bld 109 (*)    All other components within normal limits  CBC - Abnormal; Notable for the following:    RBC 5.24 (*)    Hemoglobin 15.5 (*)    All other components within normal limits  URINALYSIS, ROUTINE W REFLEX MICROSCOPIC - Abnormal; Notable for the following:    Specific Gravity, Urine 1.003 (*)    All other components within normal limits  LIPASE, BLOOD  I-STAT CG4 LACTIC ACID, ED    EKG  EKG Interpretation None       Radiology Ct Abdomen Pelvis W Contrast  Result Date: 04/12/2016 CLINICAL DATA:  Acute generalized abdominal pain. EXAM: CT ABDOMEN AND PELVIS WITH CONTRAST TECHNIQUE: Multidetector CT imaging  of the abdomen and pelvis was performed using the standard protocol following bolus administration of intravenous contrast. CONTRAST:  100mL ISOVUE-300 IOPAMIDOL (ISOVUE-300) INJECTION 61% COMPARISON:  CT scan of March 13, 2010. FINDINGS: Lower chest: No acute abnormality. Hepatobiliary: Status post cholecystectomy. No focal abnormality is noted in the liver. Stable intrahepatic and extrahepatic biliary dilatation is noted most consistent with post cholecystectomy status. Pancreas: Unremarkable. No pancreatic ductal dilatation or surrounding inflammatory changes. Spleen: Normal in size without focal abnormality. Adrenals/Urinary Tract: Adrenal glands are unremarkable. Kidneys are normal, without renal calculi, focal lesion, or hydronephrosis. Bladder is unremarkable. Stomach/Bowel: There is no evidence of bowel obstruction. Focal diverticulitis is noted in the proximal sigmoid colon. Vascular/Lymphatic: No significant vascular findings are present. No enlarged abdominal or pelvic lymph nodes. Reproductive: Uterus and bilateral adnexa are unremarkable. Other: No abdominal wall hernia or  abnormality. No abdominopelvic ascites. Musculoskeletal: No acute or significant osseous findings. IMPRESSION: Focal diverticulitis of proximal sigmoid colon. No definite abscess is noted. Electronically Signed   By: Lupita Raider, M.D.   On: 04/12/2016 23:13    Procedures Procedures (including critical care time)  Medications Ordered in ED Medications  ondansetron (ZOFRAN) injection 4 mg (4 mg Intravenous Given 04/12/16 2050)  sodium chloride 0.9 % bolus 1,000 mL (0 mLs Intravenous Stopped 04/12/16 2223)  morphine 4 MG/ML injection 4 mg (4 mg Intravenous Given 04/12/16 2125)  ondansetron (ZOFRAN) injection 4 mg (4 mg Intravenous Given 04/12/16 2231)  morphine 4 MG/ML injection 4 mg (4 mg Intravenous Given 04/12/16 2232)  gi cocktail (Maalox,Lidocaine,Donnatal) (30 mLs Oral Given 04/12/16 2231)  iopamidol (ISOVUE-300) 61  % injection 100 mL (100 mLs Intravenous Contrast Given 04/12/16 2239)  diphenhydrAMINE (BENADRYL) injection 25 mg (25 mg Intravenous Given 04/12/16 2252)  methylPREDNISolone sodium succinate (SOLU-MEDROL) 125 mg/2 mL injection 125 mg (125 mg Intravenous Given 04/12/16 2304)  famotidine (PEPCID) IVPB 20 mg premix (0 mg Intravenous Stopped 04/12/16 2319)  Ampicillin-Sulbactam (UNASYN) 3 g in sodium chloride 0.9 % 100 mL IVPB (0 g Intravenous Stopped 04/13/16 0038)  sodium chloride 0.9 % bolus 500 mL (0 mLs Intravenous Stopped 04/13/16 0113)     Initial Impression / Assessment and Plan / ED Course  I have reviewed the triage vital signs and the nursing notes.  Pertinent labs & imaging results that were available during my care of the patient were reviewed by me and considered in my medical decision making (see chart for details).      Patient presents to the ED with no significant past medical history with complaints of lower abdominal pain, back pain, nausea, subjective fevers, urinary symptoms onset yesterday. Patient does have tenderness to palpation of the lower abdomen along with CVA tenderness of the left flank. Labs were obtained. No leukocytosis. CMP normal without any electrolyte abnormalities. UA shows no signs of infection. Patient was initially tachycardic. Afebrile. She was nontoxic appearing. Abdomen without any signs of peritonitis. Tachycardia improved with fluids. Pain was improved with pain medicine. CAT scan of abdomen was ordered.  Was notified by nurse that patient was trying oral contrast and developed severe epigastric abdominal pain. Patient was reassessed. She did have focal tenderness to the epigastric region. No vital sign abnormalities that time. Additional pain medicine was given along with GI cocktail for possible irritation to the contrast. Patient was quickly sent to CT scan to rule out any perforation.  When patient returned from CT scan after receiving IV contrast CT read  tach notified me that patient developed severe hives and itching with increased breathing. Patient was reassessed. She had diffuse urticarial lesions noted to the face that were pruritic in nature. Oropharynx was clear without any signs of angioedema. Mild increased respirations but no signs of respiratory distress. Patient likely had anaphylactic reaction to IV contrast. Pressures had been decreasing after the reaction. She was given Solu-Medrol, Benadryl, Pepcid, fluids, additional pain medicine. CT scan did show diverticulitis without perforation or abscess to the proximal sigmoid colon. Patient does have allergy to Flagyl and Cipro. Unasyn was started. Pressures have slightly improved. Latest blood pressure was 106/76. Patient denies any pain at this time. Abdomen is nontender and she has no signs of rigid abdomen. Patient is satting well and is not tachypnea at this time. Due to patient's anaphylactic reaction with hypotension along with diverticulitis. Feel the patient would benefit from admission to the  hospital for observation given that she needs steroids for this anaphylactic reaction on top of a superimposed infection requiring antibiotics. Lactic was normal. Patient meets no surgeries or sepsis criteria. She is afebrile and not tachycardic. No leukocytosis. Doubt septic shock. Spoke with Dr. Julian Reil with tried hospital medicine for admission to [facility]. He agrees to accept patient in transfer to the hospital. He placed admission orders for patient to be admitted to stepdown ICU. Patient is currently hemodynamically stable at this time. Vital signs remained stable. Patient will be transferred by [facility] to [facility] ED for further management. Patient was seen and examined by Dr. Judd Lien who is agreeable to the above plan.   CRITICAL CARE Performed by: Demetrios Loll   Total critical care time: 30 minutes  Critical care time was exclusive of separately billable procedures and  treating other patients.  Critical care was necessary to treat or prevent imminent or life-threatening deterioration.  Critical care was time spent personally by me on the following activities: development of treatment plan with patient and/or surrogate as well as nursing, discussions with consultants, evaluation of patient's response to treatment, examination of patient, obtaining history from patient or surrogate, ordering and performing treatments and interventions, ordering and review of laboratory studies, ordering and review of radiographic studies, pulse oximetry and re-evaluation of patient's condition.   Final Clinical Impressions(s) / ED Diagnoses   Final diagnoses:  Anaphylaxis, initial encounter  Diverticulitis of large intestine without perforation or abscess without bleeding    New Prescriptions New Prescriptions   No medications on file  I personally performed the services described in this documentation, which was scribed in my presence. The recorded information has been reviewed and is accurate.     Rise Mu, PA-C 04/13/16 0128    Rise Mu, PA-C 04/13/16 0128    Geoffery Lyons, MD 04/15/16 [phone]

## 2016-04-12 NOTE — ED Triage Notes (Signed)
Patient reports abdominal pain as well as upper and lower back which began yesterday.  Reports nausea, vomiting.  Denies diarrhea.  Reports dysuria.

## 2016-04-12 NOTE — ED Notes (Signed)
Alert, NAD, calm, interactive, resps e/u, speaking in clear complete sentences, no dyspnea noted, skin W&D, VSS, reports only mild improved pain (denies: sob, nausea, or itching), [facility] Updated with results and plan.

## 2016-04-12 NOTE — ED Notes (Signed)
Pt states she is unable to provide urine sample at this time but once nausea calms down, pt states she will try to produce sample.  Pt and family at bedside advised to call RN once sample collected.

## 2016-04-13 DIAGNOSIS — K5792 Diverticulitis of intestine, part unspecified, without perforation or abscess without bleeding: Secondary | ICD-10-CM | POA: Diagnosis not present

## 2016-04-13 DIAGNOSIS — T782XXD Anaphylactic shock, unspecified, subsequent encounter: Secondary | ICD-10-CM

## 2016-04-13 DIAGNOSIS — T782XXA Anaphylactic shock, unspecified, initial encounter: Secondary | ICD-10-CM | POA: Diagnosis present

## 2016-04-13 DIAGNOSIS — K5732 Diverticulitis of large intestine without perforation or abscess without bleeding: Secondary | ICD-10-CM | POA: Diagnosis not present

## 2016-04-13 DIAGNOSIS — J45909 Unspecified asthma, uncomplicated: Secondary | ICD-10-CM | POA: Diagnosis present

## 2016-04-13 DIAGNOSIS — M1990 Unspecified osteoarthritis, unspecified site: Secondary | ICD-10-CM | POA: Diagnosis present

## 2016-04-13 DIAGNOSIS — Z91041 Radiographic dye allergy status: Secondary | ICD-10-CM | POA: Diagnosis not present

## 2016-04-13 DIAGNOSIS — Z9049 Acquired absence of other specified parts of digestive tract: Secondary | ICD-10-CM | POA: Diagnosis not present

## 2016-04-13 DIAGNOSIS — Y92239 Unspecified place in hospital as the place of occurrence of the external cause: Secondary | ICD-10-CM | POA: Diagnosis not present

## 2016-04-13 DIAGNOSIS — Z881 Allergy status to other antibiotic agents status: Secondary | ICD-10-CM | POA: Diagnosis not present

## 2016-04-13 DIAGNOSIS — T886XXA Anaphylactic reaction due to adverse effect of correct drug or medicament properly administered, initial encounter: Secondary | ICD-10-CM | POA: Diagnosis not present

## 2016-04-13 DIAGNOSIS — Y848 Other medical procedures as the cause of abnormal reaction of the patient, or of later complication, without mention of misadventure at the time of the procedure: Secondary | ICD-10-CM | POA: Diagnosis not present

## 2016-04-13 DIAGNOSIS — E860 Dehydration: Secondary | ICD-10-CM | POA: Diagnosis present

## 2016-04-13 DIAGNOSIS — Z888 Allergy status to other drugs, medicaments and biological substances status: Secondary | ICD-10-CM | POA: Diagnosis not present

## 2016-04-13 DIAGNOSIS — I10 Essential (primary) hypertension: Secondary | ICD-10-CM | POA: Diagnosis present

## 2016-04-13 DIAGNOSIS — T508X5A Adverse effect of diagnostic agents, initial encounter: Secondary | ICD-10-CM | POA: Diagnosis not present

## 2016-04-13 LAB — MRSA PCR SCREENING: MRSA BY PCR: NEGATIVE

## 2016-04-13 LAB — I-STAT CG4 LACTIC ACID, ED: LACTIC ACID, VENOUS: 0.55 mmol/L (ref 0.5–1.9)

## 2016-04-13 LAB — GLUCOSE, CAPILLARY: Glucose-Capillary: 149 mg/dL — ABNORMAL HIGH (ref 65–99)

## 2016-04-13 MED ORDER — SODIUM CHLORIDE 0.9 % IV BOLUS (SEPSIS)
500.0000 mL | Freq: Once | INTRAVENOUS | Status: AC
  Administered 2016-04-13: 500 mL via INTRAVENOUS

## 2016-04-13 MED ORDER — SODIUM CHLORIDE 0.9 % IV BOLUS (SEPSIS)
1000.0000 mL | Freq: Once | INTRAVENOUS | Status: AC
  Administered 2016-04-13: 1000 mL via INTRAVENOUS

## 2016-04-13 MED ORDER — TOPIRAMATE 25 MG PO TABS
25.0000 mg | ORAL_TABLET | Freq: Every day | ORAL | Status: DC
  Administered 2016-04-13: 25 mg via ORAL
  Filled 2016-04-13: qty 1

## 2016-04-13 MED ORDER — ENOXAPARIN SODIUM 40 MG/0.4ML ~~LOC~~ SOLN: 40.0000 mg | SUBCUTANEOUS | Status: DC

## 2016-04-13 MED ORDER — HYDROCODONE-ACETAMINOPHEN 5-325 MG PO TABS
1.0000 | ORAL_TABLET | ORAL | Status: DC | PRN
Start: 2016-04-13 — End: 2016-04-14

## 2016-04-13 MED ORDER — SODIUM CHLORIDE 0.9 % IV SOLN
INTRAVENOUS | Status: DC
  Administered 2016-04-13: 125 mL via INTRAVENOUS
  Administered 2016-04-13: 15:00:00 via INTRAVENOUS

## 2016-04-13 MED ORDER — TRAMADOL HCL 50 MG PO TABS: 50.0000 mg | ORAL_TABLET | Freq: Four times a day (QID) | ORAL | Status: DC | PRN

## 2016-04-13 MED ORDER — DIPHENHYDRAMINE HCL 50 MG/ML IJ SOLN
25.0000 mg | Freq: Once | INTRAMUSCULAR | Status: AC
  Administered 2016-04-13: 25 mg via INTRAVENOUS
  Filled 2016-04-13: qty 1

## 2016-04-13 MED ORDER — METOPROLOL SUCCINATE ER 25 MG PO TB24: 25.0000 mg | ORAL_TABLET | Freq: Every day | ORAL | Status: DC

## 2016-04-13 MED ORDER — SODIUM CHLORIDE 0.9 % IV SOLN
3.0000 g | Freq: Four times a day (QID) | INTRAVENOUS | Status: DC
  Administered 2016-04-13 – 2016-04-14 (×6): 3 g via INTRAVENOUS
  Filled 2016-04-13 (×8): qty 3

## 2016-04-13 MED ORDER — POLYETHYLENE GLYCOL 3350 17 G PO PACK
17.0000 g | PACK | Freq: Every day | ORAL | Status: DC
  Filled 2016-04-13 (×2): qty 1

## 2016-04-13 MED ORDER — ENOXAPARIN SODIUM 40 MG/0.4ML ~~LOC~~ SOLN
40.0000 mg | Freq: Every day | SUBCUTANEOUS | Status: DC
  Administered 2016-04-14: 40 mg via SUBCUTANEOUS
  Filled 2016-04-13 (×2): qty 0.4

## 2016-04-13 NOTE — Progress Notes (Addendum)
BP in 80s, doing 1L NS bolus now.  No hives, rash, itching, etc to suggest biphasic anaphylaxis at this time however.  After NS bolus started, repeat BP has rapidly increased and is now 111 systolic.  Patient still feels light headed though.

## 2016-04-13 NOTE — ED Notes (Signed)
No changes, VSS, BP lower after abx, NS IVF bolus infusing.

## 2016-04-13 NOTE — Progress Notes (Signed)
Pharmacy Antibiotic Note  Linda BinetFarkhanda Sloan is a 51 y.o. female with hx of diverticultis now with abdominal pain admitted on 04/12/2016 with intra-abdominal infection.  Pharmacy has been consulted for Unasyn dosing.  Plan: Unasyn 3 Gm IV q6h  Height: 5\' 3"  (160 cm) Weight: 144 lb 6.4 oz (65.5 kg) IBW/kg (Calculated) : 52.4  Temp (24hrs), Avg:98.7 F (37.1 C), Min:97.9 F (36.6 C), Max:99.7 F (37.6 C)   Recent Labs Lab 04/12/16 2034 04/13/16 0132  WBC 10.3  --   CREATININE 0.72  --   LATICACIDVEN  --  0.55    Estimated Creatinine Clearance: 74 mL/min (by C-G formula based on SCr of 0.72 mg/dL).    Allergies  Allergen Reactions  . Flagyl [Metronidazole] Shortness Of Breath  . Ivp Dye [Iodinated Diagnostic Agents] Anaphylaxis and Hives    Hives and itching after PO & IV contrast  . Sumatriptan Shortness Of Breath  . Clindamycin/Lincomycin     Antimicrobials this admission: 3/26 unasyn >>    >>   Dose adjustments this admission:   Microbiology results:  BCx:   UCx:    Sputum:    MRSA PCR:   Thank you for allowing pharmacy to be a part of this patient's care.  Lorenza EvangelistGreen, Elyon Zoll R 04/13/2016 6:18 AM

## 2016-04-13 NOTE — Progress Notes (Addendum)
Nursing Note: Called to room by pt.Pt c/o swelling similar to when she had reaction to IV dye.Pt c/o swelling to her face,r hand and  r fingers and L wrist.A: assessed pt and no observable  Swelling noted  to face.Pt does have a little swelling to r hand and digits to her r hand.non-pitting.Pt c/o swelling to her L wrist.she says her arm bracelet was loose earlier and is now taunt.Did note some swelling in wrist but armband is not tight so, I will leave it in place to gauge swelling in the future.Lungs clear,denies SOB,still has a little redness at base of her neck.[not new].T-97.7 P-82 R-19 Bp-123/73.PO2 99% on r/a.Denies pain or distress just that swelling in her r hand limits her movement she says.A: Paged on-call.Both arms are elevated on pillows and explained to pt that this would help with swelling and o keep them elevated.Pt agreeable and verbalizes understanding.Assessed  Pt's IV site to be sure this was not contributing to swelling.IV site unremarakable.wbb

## 2016-04-13 NOTE — ED Notes (Signed)
Report given to [facility], pending arrival, no changes, VSS.

## 2016-04-13 NOTE — Plan of Care (Signed)
Diverticulitis which would be uncomplicated (other than she has allergy to Flagyl), but patient developed hives and ultimately mild hypotension (anaphylaxis) in ED after being given IV contrast dye.  Getting IVF, steroids, benadryl.  Also got Unasyn for diverticulitis.  Coming to SDU for Observation.

## 2016-04-13 NOTE — H&P (Signed)
History and Physical    Linda BinetFarkhanda Syverson XBJ:[id number] DOB: Oct 31, 1965 DOA: 04/12/2016  PCP: [facility]  Patient coming from: Home  I have personally briefly reviewed patient[facility]  Chief Complaint: Abdominal pain  HPI: Linda Sloan is a 51 y.o. female with medical history significant of diverticulitis of colon requiring "part of my colon removed" several years ago.  [facility] with c/o abdominal pain.  Symptoms onset yesterday, 9/10 in severity, exacerbated by palpation, associated nausea, fever, back pain, flank pain.  Nothing taken for pain PTA.   ED Course: Work up for pain in ED revealed to cause to be acute diverticulitis of proximal sigmoid colon without focal abscess or other obvious complication.  Unfortunately however, the patients ED stay became complicated by a severe allergic reaction presumably to the IV contrast dye.  She developed hives, itching, worsening abdominal pain, and even mild hypotension.  She was treated with benadryl, IVF, and solumedrol.  Hospitalist has been asked to admit for obs.   Review of Systems: As per HPI otherwise 10 point review of systems negative.   Past Medical History:  Diagnosis Date  . Allergy   . Arthritis   . Asthma   . History of abdominal pain    Abdominal tenderness, LLQ  . History of diverticulitis of colon    Resolved 01/2012  . Hypertension     Past Surgical History:  Procedure Laterality Date  . APPENDECTOMY    . CHOLECYSTECTOMY  1998  . HERNIA REPAIR  2014   Lipoma   Michell HeinrichLancer, MD  . LAPAROSCOPY  2012   Kathrynn RunningMary D. Lanae BoastShearin, MD  . Daiva HugeSIGMOIDOSCOPY N/A 2013   Fiberoptic Olin PiaJames Dasher, MD  . TUBAL LIGATION       reports that she has never smoked. She has never used smokeless tobacco. She reports that she does not drink alcohol or use drugs.  Allergies  Allergen Reactions  . Flagyl [Metronidazole] Shortness Of Breath  . Ivp Dye [Iodinated  Diagnostic Agents] Anaphylaxis and Hives    Hives and itching after PO & IV contrast  . Sumatriptan Shortness Of Breath  . Clindamycin/Lincomycin     Family History  Problem Relation Age of Onset  . Stroke Mother 2281  . Diabetes Mother 4181  . Arthritis Mother 5645  . Heart disease Mother   . Hyperlipidemia Mother   . Hypertension Mother   . Heart disease Father 40    heart attack  . Stroke Father 5588  . Cancer Brother 860  . Hyperlipidemia Sister      Prior to Admission medications   Medication Sig Start Date End Date Taking? Authorizing Provider  metoprolol succinate (TOPROL-XL) 25 MG 24 hr tablet Take 25 mg by mouth daily.   Yes Historical Provider, MD  traMADol (ULTRAM) 50 MG tablet Take 50 mg by mouth every 6 (six) hours as needed.   Yes Historical Provider, MD  polyethylene glycol (MIRALAX / GLYCOLAX) packet Take 17 g by mouth daily.    Historical Provider, MD  topiramate (TOPAMAX) 25 MG tablet Take 1 tablet (25 mg total) by mouth at bedtime. 11/27/15   Kirt BoysMonica Carter, DO    Physical Exam: Vitals:   04/13/16 0131 04/13/16 0145 04/13/16 0200 04/13/16 0215  BP:  96/65 97/68 93/65   Pulse:  79 75 77  Resp:      Temp: 98.6 F (37 C)     TempSrc: Oral     SpO2:  95% 95% 95%  Weight:      Height:        Constitutional: NAD, calm, comfortable Eyes: PERRL, lids and conjunctivae normal ENMT: Mucous membranes are moist. Posterior pharynx clear of any exudate or lesions.Normal dentition.  Neck: normal, supple, no masses, no thyromegaly Respiratory: clear to auscultation bilaterally, no wheezing, no crackles. Normal respiratory effort. No accessory muscle use.  Cardiovascular: Regular rate and rhythm, no murmurs / rubs / gallops. No extremity edema. 2+ pedal pulses. No carotid bruits.  Abdomen: no tenderness, no masses palpated. No hepatosplenomegaly. Bowel sounds positive.  Musculoskeletal: no clubbing / cyanosis. No joint deformity upper and lower extremities. Good ROM, no  contractures. Normal muscle tone.  Skin: no rashes, lesions, ulcers. No induration Neurologic: CN 2-12 grossly intact. Sensation intact, DTR normal. Strength 5/5 in all 4.  Psychiatric: Normal judgment and insight. Alert and oriented x 3. Normal mood.    Labs on Admission: I have personally reviewed following labs and imaging studies  CBC:  Recent Labs Lab 04/12/16 2034  WBC 10.3  HGB 15.5*  HCT 44.3  MCV 84.5  PLT 269   Basic Metabolic Panel:  Recent Labs Lab 04/12/16 2034  NA 137  K 3.6  CL 105  CO2 23  GLUCOSE 109*  BUN 10  CREATININE 0.72  CALCIUM 9.3   GFR: Estimated Creatinine Clearance: 71.5 mL/min (by C-G formula based on SCr of 0.72 mg/dL). Liver Function Tests:  Recent Labs Lab 04/12/16 2034  AST 33  ALT 21  ALKPHOS 90  BILITOT 0.8  PROT 7.6  ALBUMIN 4.0    Recent Labs Lab 04/12/16 2034  LIPASE 27   No results for input(s): AMMONIA in the last 168 hours. Coagulation Profile: No results for input(s): INR, PROTIME in the last 168 hours. Cardiac Enzymes: No results for input(s): CKTOTAL, CKMB, CKMBINDEX, TROPONINI in the last 168 hours. BNP (last 3 results) No results for input(s): PROBNP in the last 8760 hours. HbA1C: No results for input(s): HGBA1C in the last 72 hours. CBG: No results for input(s): GLUCAP in the last 168 hours. Lipid Profile: No results for input(s): CHOL, HDL, LDLCALC, TRIG, CHOLHDL, LDLDIRECT in the last 72 hours. Thyroid Function Tests: No results for input(s): TSH, T4TOTAL, FREET4, T3FREE, THYROIDAB in the last 72 hours. Anemia Panel: No results for input(s): VITAMINB12, FOLATE, FERRITIN, TIBC, IRON, RETICCTPCT in the last 72 hours. Urine analysis:    Component Value Date/Time   COLORURINE YELLOW 04/12/2016 2205   APPEARANCEUR CLEAR 04/12/2016 2205   LABSPEC 1.003 (L) 04/12/2016 2205   PHURINE 7.0 04/12/2016 2205   GLUCOSEU NEGATIVE 04/12/2016 2205   HGBUR NEGATIVE 04/12/2016 2205   BILIRUBINUR NEGATIVE  04/12/2016 2205   BILIRUBINUR negative 06/05/2014 1013   KETONESUR NEGATIVE 04/12/2016 2205   PROTEINUR NEGATIVE 04/12/2016 2205   UROBILINOGEN 0.2 06/05/2014 1013   UROBILINOGEN 0.2 03/13/2010 1839   NITRITE NEGATIVE 04/12/2016 2205   LEUKOCYTESUR NEGATIVE 04/12/2016 2205    Radiological Exams on Admission: Ct Abdomen Pelvis W Contrast  Result Date: 04/12/2016 CLINICAL DATA:  Acute generalized abdominal pain. EXAM: CT ABDOMEN AND PELVIS WITH CONTRAST TECHNIQUE: Multidetector CT imaging of the abdomen and pelvis was performed using the standard protocol following bolus administration of intravenous contrast. CONTRAST:  ISOVUE-300 IOPAMIDOL (ISOVUE-300) INJECTION 61% COMPARISON:  CT scan of March 13, 2010. FINDINGS: Lower chest: No acute abnormality. Hepatobiliary: Status post cholecystectomy. No focal abnormality is noted in the liver. Stable intrahepatic and extrahepatic biliary dilatation is noted most consistent with  post cholecystectomy status. Pancreas: Unremarkable. No pancreatic ductal dilatation or surrounding inflammatory changes. Spleen: Normal in size without focal abnormality. Adrenals/Urinary Tract: Adrenal glands are unremarkable. Kidneys are normal, without renal calculi, focal lesion, or hydronephrosis. Bladder is unremarkable. Stomach/Bowel: There is no evidence of bowel obstruction. Focal diverticulitis is noted in the proximal sigmoid colon. Vascular/Lymphatic: No significant vascular findings are present. No enlarged abdominal or pelvic lymph nodes. Reproductive: Uterus and bilateral adnexa are unremarkable. Other: No abdominal wall hernia or abnormality. No abdominopelvic ascites. Musculoskeletal: No acute or significant osseous findings. IMPRESSION: Focal diverticulitis of proximal sigmoid colon. No definite abscess is noted. Electronically Signed   By: Lupita Raider, M.D.   On: 04/12/2016 23:13    EKG: Independently reviewed.  Assessment/Plan Principal Problem:    Anaphylaxis Active Problems:   Acute diverticulitis    1. Anaphylaxis - presumably to IV contrast dye 1. Hypotension she had at the time I accepted her to SDU from [facility] ED already appears to have resolved. 2. Will continue IVF 3. Will hold off on any further steroids at this time unless allergic reaction symptoms re-develop. 2. Acute diverticulitis - 1. Unasyn while in hospital 2. Appears uncomplicated at the moment 3. Likely can be discharged later today of pain control is adequate on PO ABx.  DVT prophylaxis: Lovenox Code Status: Full Family Communication: Husband at bedside Disposition Plan: Home later today Consults called: None Admission status: Place in [place], [place]. DO [facility] Pager [phone]  If 7AM-7PM, please contact day team taking care of patient [email or web address] Password [facility]  04/13/2016, 3:26 AM

## 2016-04-13 NOTE — Progress Notes (Addendum)
[facility]  Patient admitted this AM by my colleague. H and P and chart reviewed and patient examined. Plan discussed with patient, family and RN.   No c/o itching, swelling, dyspnea. Has pain in lower abdomen. No nausea or vomiting. Have a small amount of clear liquids and tolerating. Voided once since coming into the hospital. No fever or chills. No diarrhea.   S/p bowel resection for diverticulitis about 6 yrs ago.  Has had multiple episodes of diverticulitis last year and has not needed to be admitted as episodes are usually controlled by oral antibiotics. She states this episode was different as instead of slow progression of pain, she had sudden severe pain in lower abdomen and upper back as well. Her physician advised her to be evaluated in there ER to ensure the underlying pathology was not something other than diverticulitis.  She had a colonoscopy by Dr Lanae BoastShearin 5 wks ago which she states looked fine.  She developed hives, itching, hypotension from IV dye. Her last CT with contrast caused her to lose here voice for a few hours but she had no further reaction.   Principal Problem:   Anaphylaxis - to IV dye - has resolved with solumedrol, Benadryl and Pepcid  Active Problems:   Acute diverticulitis with dehydration - Unasyn, pain control, IVF as she is drinking poorly and has only voided once - follow I and O  Linda CantorSaima Shriyan Arakawa, MD

## 2016-04-14 LAB — HIV ANTIBODY (ROUTINE TESTING W REFLEX)
HIV SCREEN 4TH GENERATION: NONREACTIVE
HIV Screen 4th Generation wRfx: NONREACTIVE

## 2016-04-14 MED ORDER — DIPHENHYDRAMINE HCL 50 MG/ML IJ SOLN
12.5000 mg | Freq: Once | INTRAMUSCULAR | Status: AC
  Administered 2016-04-14: 12.5 mg via INTRAVENOUS
  Filled 2016-04-14: qty 1

## 2016-04-14 MED ORDER — FAMOTIDINE 20 MG PO TABS
20.0000 mg | ORAL_TABLET | Freq: Two times a day (BID) | ORAL | Status: DC
  Administered 2016-04-14: 20 mg via ORAL
  Filled 2016-04-14: qty 1

## 2016-04-14 MED ORDER — DIPHENHYDRAMINE HCL 25 MG PO TABS: 25.0000 mg | ORAL_TABLET | Freq: Four times a day (QID) | ORAL | 0 refills | Status: AC | PRN

## 2016-04-14 MED ORDER — AMOXICILLIN-POT CLAVULANATE 875-125 MG PO TABS: 1.0000 | ORAL_TABLET | Freq: Two times a day (BID) | ORAL | 0 refills | Status: AC

## 2016-04-14 MED ORDER — FAMOTIDINE 20 MG PO TABS: 20.0000 mg | ORAL_TABLET | Freq: Two times a day (BID) | ORAL | 0 refills | Status: AC

## 2016-04-14 MED ORDER — DIPHENHYDRAMINE HCL 50 MG/ML IJ SOLN
INTRAMUSCULAR | Status: AC
  Filled 2016-04-14: qty 1

## 2016-04-14 NOTE — Progress Notes (Signed)
Patient d/c home. Stable. 

## 2016-04-14 NOTE — Progress Notes (Signed)
Nursing Note: Pt asleep.Aroused pt to give meds.No complaints,no distress noted.Hands both look less swollen.wbb

## 2016-04-14 NOTE — Progress Notes (Signed)
Discharge instructions given,verbalized understanding. Denies pain nor itching. Stable.

## 2016-04-14 NOTE — Progress Notes (Signed)
Nursing Note: IV benadryl given per orders.wbb

## 2016-04-14 NOTE — Discharge Instructions (Signed)

## 2016-04-14 NOTE — Discharge Summary (Signed)
Physician Discharge Summary  Linda Sloan ZOX:[id number] DOB: Dec 30, 1965 DOA: 04/12/2016  PCP: [facility]  Admit date: 04/12/2016 Discharge date: 04/14/2016  Admitted From: home Disposition:  home   Recommendations for Outpatient Follow-up:  1. Avoid IV dye  Home Health:  none  Equipment/Devices:  none    Discharge Condition:  stable   CODE STATUS:  Full code   Consultations:  none    Discharge Diagnoses:  Principal Problem:   Anaphylaxis Active Problems:   Acute diverticulitis       Brief Summary: Linda BinetFarkhanda Lad is a 51 y.o. female with medical history significant of diverticulitis of colon who presents with abdominal pain. S/p bowel resection for diverticulitis about 6 yrs ago.  Has had multiple episodes of diverticulitis last year and has not needed to be admitted as episodes are usually controlled by oral antibiotics.She had a colonoscopy by Dr Lanae BoastShearin 5 wks ago which she states looked fine.  She states this episode was different as instead of slow progression of pain, she had sudden severe pain in lower abdomen and upper back as well. Her physician advised her to be evaluated in there ER to ensure the underlying pathology was not something other than diverticulitis.  CT scan abd/pelvis was consistent with proximal sigmoid diverticulitis. She developed hives, itching, hypotension from IV dye. Her last CT with contrast caused her to lose here voice for a few hours but she had no further reaction.   Hospital Course:  Anaphylaxis - to IV dye - has improved but not resolved- she received 1 dose of Solumedrol and subsequently has not needed further - she has been using Benadryl and Pepcid - she needed a dose of Benadryl and Pepcid this AM for localized swelling and itching in right hand, arm, right face,left anterior chest wall and mild sensation of dyspnea. Mostly resolved after treatment - can continue Benadryl and Pepcid as needed at home- should  not need steroids. .   Active Problems:   Acute diverticulitis with dehydration - Unasyn, pain control, IVF as she was drinking poorly and has only voided once - advanced to full liquids last night -better hydrated today - LLQ pain improved today - transition from Unasyn to Augmentin for home  Discharge Instructions  Discharge Instructions    Discharge instructions    Complete by:  As directed    Full liquid diet for next 24-48 hrs. Then soft diet without fiber for 1-2 wks. Then resume your prior diet as long as you continue to improve.   Increase activity slowly    Complete by:  As directed      Allergies as of 04/14/2016      Reactions   Clindamycin/lincomycin Shortness Of Breath   Flagyl [metronidazole] Shortness Of Breath   Ivp Dye [iodinated Diagnostic Agents] Anaphylaxis, Hives   Hives and itching after PO & IV contrast, BP also dropped   Sumatriptan Shortness Of Breath      Medication List    TAKE these medications   acetaminophen 500 MG tablet Commonly known as:  TYLENOL Take 1,000 mg by mouth every 6 (six) hours as needed for moderate pain.   amoxicillin-clavulanate 875-125 MG tablet Commonly known as:  AUGMENTIN Take 1 tablet by mouth 2 (two) times daily.   diphenhydrAMINE 25 MG tablet Commonly known as:  BENADRYL Take 1-2 tablets (25-50 mg total) by mouth every 6 (six) hours as needed for itching or allergies.   famotidine 20 MG tablet Commonly known as:  PEPCID Take  1 tablet (20 mg total) by mouth 2 (two) times daily.   metoprolol succinate 25 MG 24 hr tablet Commonly known as:  TOPROL-XL Take 25 mg by mouth daily.   polyethylene glycol packet Commonly known as:  MIRALAX / GLYCOLAX Take 17 g by mouth every other day.   topiramate 25 MG tablet Commonly known as:  TOPAMAX Take 1 tablet (25 mg total) by mouth at bedtime.   traMADol 50 MG tablet Commonly known as:  ULTRAM Take 50 mg by mouth daily as needed (for headache).       Allergies   Allergen Reactions  . Clindamycin/Lincomycin Shortness Of Breath  . Flagyl [Metronidazole] Shortness Of Breath  . Ivp Dye [Iodinated Diagnostic Agents] Anaphylaxis and Hives    Hives and itching after PO & IV contrast, BP also dropped  . Sumatriptan Shortness Of Breath     Procedures/Studies:    Ct Abdomen Pelvis W Contrast  Result Date: 04/12/2016 CLINICAL DATA:  Acute generalized abdominal pain. EXAM: CT ABDOMEN AND PELVIS WITH CONTRAST TECHNIQUE: Multidetector CT imaging of the abdomen and pelvis was performed using the standard protocol following bolus administration of intravenous contrast. CONTRAST:  ISOVUE-300 IOPAMIDOL (ISOVUE-300) INJECTION 61% COMPARISON:  CT scan of March 13, 2010. FINDINGS: Lower chest: No acute abnormality. Hepatobiliary: Status post cholecystectomy. No focal abnormality is noted in the liver. Stable intrahepatic and extrahepatic biliary dilatation is noted most consistent with post cholecystectomy status. Pancreas: Unremarkable. No pancreatic ductal dilatation or surrounding inflammatory changes. Spleen: Normal in size without focal abnormality. Adrenals/Urinary Tract: Adrenal glands are unremarkable. Kidneys are normal, without renal calculi, focal lesion, or hydronephrosis. Bladder is unremarkable. Stomach/Bowel: There is no evidence of bowel obstruction. Focal diverticulitis is noted in the proximal sigmoid colon. Vascular/Lymphatic: No significant vascular findings are present. No enlarged abdominal or pelvic lymph nodes. Reproductive: Uterus and bilateral adnexa are unremarkable. Other: No abdominal wall hernia or abnormality. No abdominopelvic ascites. Musculoskeletal: No acute or significant osseous findings. IMPRESSION: Focal diverticulitis of proximal sigmoid colon. No definite abscess is noted. Electronically Signed   By: Lupita Raider, M.D.   On: 04/12/2016 23:13        Discharge Exam: Vitals:   04/14/16 0940 04/14/16 1315  BP: 115/71  118/78  Pulse: 78 76  Resp: 16 18  Temp: 97.8 F (36.6 C) 98 F (36.7 C)   Vitals:   04/14/16 0149 04/14/16 0536 04/14/16 0940 04/14/16 1315  BP: 108/66 110/76 115/71 118/78  Pulse: 77 77 78 76  Resp: 19 18 16 18   Temp: 97.7 F (36.5 C) 97.6 F (36.4 C) 97.8 F (36.6 C) 98 F (36.7 C)  TempSrc: Oral Oral Oral Oral  SpO2: 99% 97% 100% 100%  Weight:      Height:        General: Pt is alert, awake, not in acute distress Cardiovascular: RRR, S1/S2 +, no rubs, no gallops Respiratory: CTA bilaterally, no wheezing, no rhonchi Abdominal: Soft, NT, ND, bowel sounds + Extremities: no edema, no cyanosis    The results of significant diagnostics from this hospitalization (including imaging, microbiology, ancillary and laboratory) are listed below for reference.     Microbiology: Recent Results (from the past 240 hour(s))  MRSA PCR Screening     Status: None   Collection Time: 04/13/16  6:25 AM  Result Value Ref Range Status   MRSA by PCR NEGATIVE NEGATIVE Final    Comment:        The GeneXpert MRSA Assay (FDA approved for  NASAL specimens only), is one component of a comprehensive MRSA colonization surveillance program. It is not intended to diagnose MRSA infection nor to guide or monitor treatment for MRSA infections.      Labs: BNP (last 3 results) No results for input(s): BNP in the last 8760 hours. Basic Metabolic Panel:  Recent Labs Lab 04/12/16 2034  NA 137  K 3.6  CL 105  CO2 23  GLUCOSE 109*  BUN 10  CREATININE 0.72  CALCIUM 9.3   Liver Function Tests:  Recent Labs Lab 04/12/16 2034  AST 33  ALT 21  ALKPHOS 90  BILITOT 0.8  PROT 7.6  ALBUMIN 4.0    Recent Labs Lab 04/12/16 2034  LIPASE 27   No results for input(s): AMMONIA in the last 168 hours. CBC:  Recent Labs Lab 04/12/16 2034  WBC 10.3  HGB 15.5*  HCT 44.3  MCV 84.5  PLT 269   Cardiac Enzymes: No results for input(s): CKTOTAL, CKMB, CKMBINDEX, TROPONINI in the last  168 hours. BNP: Invalid input(s): POCBNP CBG:  Recent Labs Lab 04/13/16 0328  GLUCAP 149*   D-Dimer No results for input(s): DDIMER in the last 72 hours. Hgb A1c No results for input(s): HGBA1C in the last 72 hours. Lipid Profile No results for input(s): CHOL, HDL, LDLCALC, TRIG, CHOLHDL, LDLDIRECT in the last 72 hours. Thyroid function studies No results for input(s): TSH, T4TOTAL, T3FREE, THYROIDAB in the last 72 hours.  Invalid input(s): FREET3 Anemia work up No results for input(s): VITAMINB12, FOLATE, FERRITIN, TIBC, IRON, RETICCTPCT in the last 72 hours. Urinalysis    Component Value Date/Time   COLORURINE YELLOW 04/12/2016 2205   APPEARANCEUR CLEAR 04/12/2016 2205   LABSPEC 1.003 (L) 04/12/2016 2205   PHURINE 7.0 04/12/2016 2205   GLUCOSEU NEGATIVE 04/12/2016 2205   HGBUR NEGATIVE 04/12/2016 2205   BILIRUBINUR NEGATIVE 04/12/2016 2205   BILIRUBINUR negative 06/05/2014 1013   KETONESUR NEGATIVE 04/12/2016 2205   PROTEINUR NEGATIVE 04/12/2016 2205   UROBILINOGEN 0.2 06/05/2014 1013   UROBILINOGEN 0.2 03/13/2010 1839   NITRITE NEGATIVE 04/12/2016 2205   LEUKOCYTESUR NEGATIVE 04/12/2016 2205   Sepsis Labs Invalid input(s): PROCALCITONIN,  WBC,  LACTICIDVEN Microbiology Recent Results (from the past 240 hour(s))  MRSA PCR Screening     Status: None   Collection Time: 04/13/16  6:25 AM  Result Value Ref Range Status   MRSA by PCR NEGATIVE NEGATIVE Final    Comment:        The GeneXpert MRSA Assay (FDA approved for NASAL specimens only), is one component of a comprehensive MRSA colonization surveillance program. It is not intended to diagnose MRSA infection nor to guide or monitor treatment for MRSA infections.      Time coordinating discharge: Over 30 minutes  SIGNED:   Calvert Cantor, MD  [facility] 04/14/2016, 2:50 PM Pager   If 7PM-7AM, please contact night-coverage [email or web address] Password [email or web address]

## 2018-11-14 IMAGING — CT CT ABD-PELV W/ CM
2 of 5 series · 16 of 46 positions shown, 18 images · IV contrast (APPLIED)
Comparison: CT scan of March 13, 2010.

CLINICAL DATA: Acute generalized abdominal pain.

EXAM:
CT ABDOMEN AND PELVIS WITH CONTRAST
TECHNIQUE: Multidetector CT imaging of the abdomen and pelvis was performed
using the standard protocol following bolus administration of
intravenous contrast.
CONTRAST:  100mL 0PICGA-P66 IOPAMIDOL (0PICGA-P66) INJECTION 61%

[Series 2: axial st · axial · 0.64mm/px · z∈[-892,-512]mm · 13 of 86 slices shown, 15 images]
[im 5/86  soft-tissue]
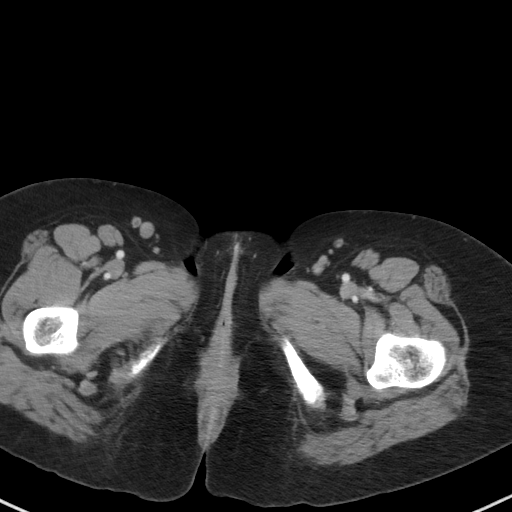
[im 5/86  bone]
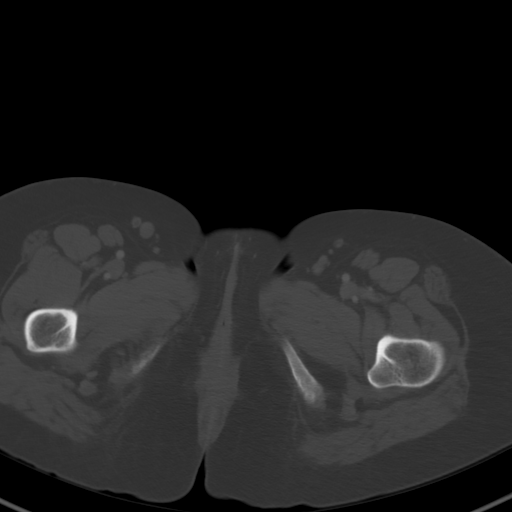
[im 13/86  soft-tissue]
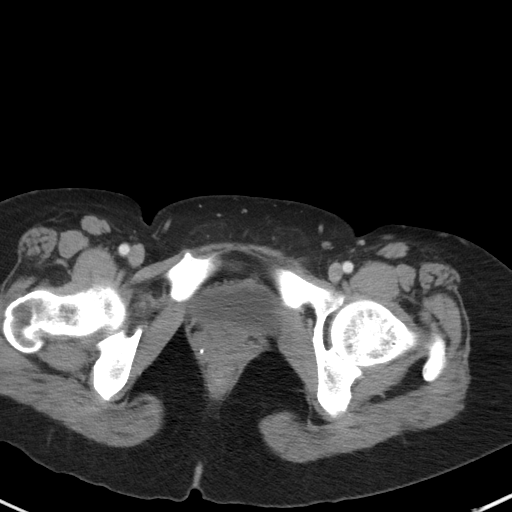
[im 18/86  soft-tissue]
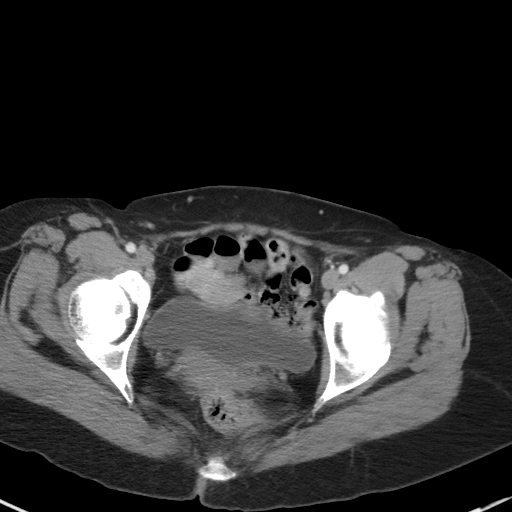
[im 26/86  soft-tissue]
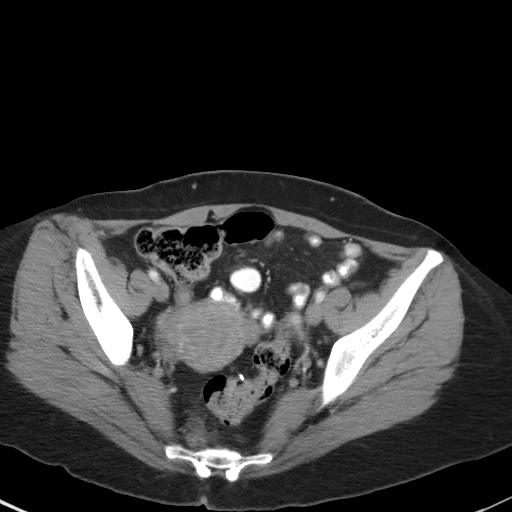
[im 30/86  soft-tissue]
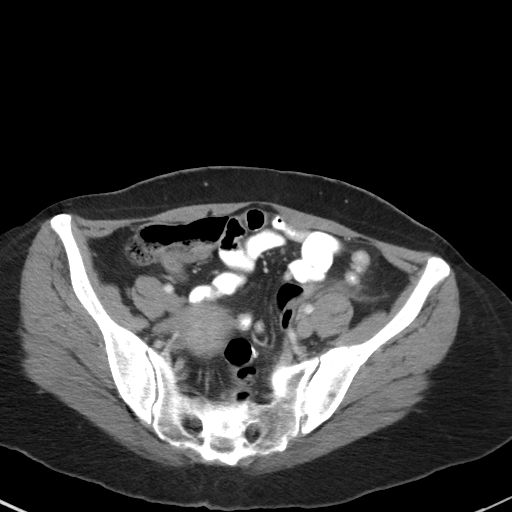
[im 39/86  soft-tissue]
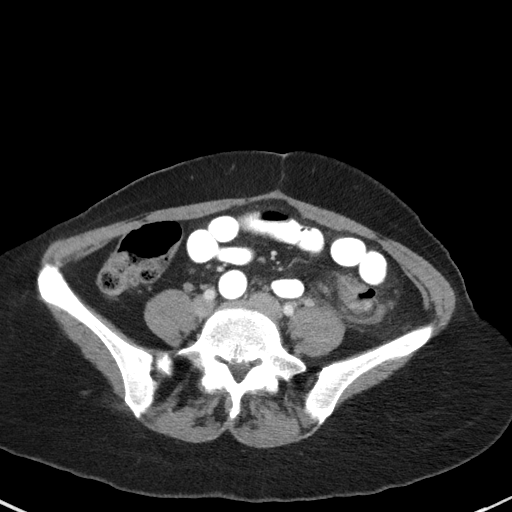
[im 43/86  soft-tissue]
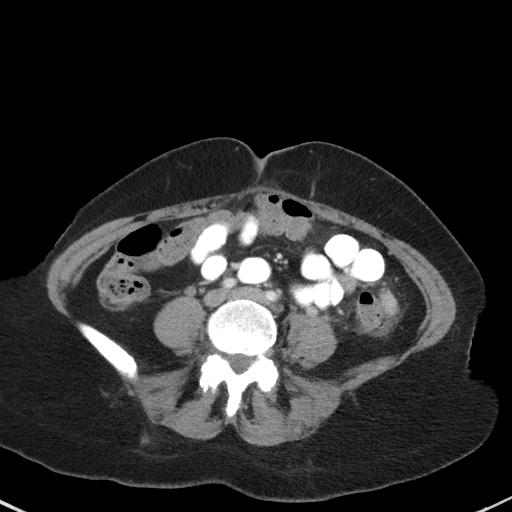
[im 47/86  soft-tissue]
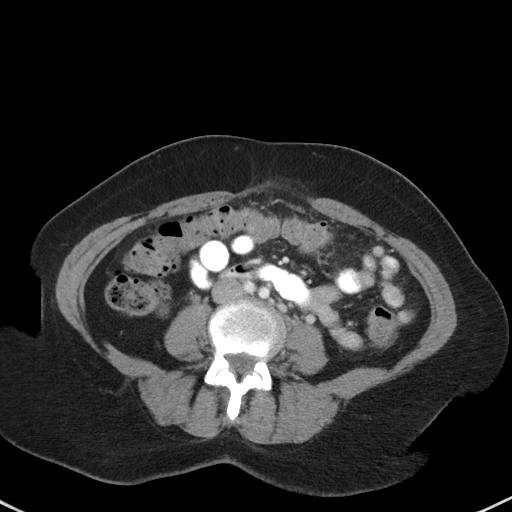
[im 56/86  soft-tissue]
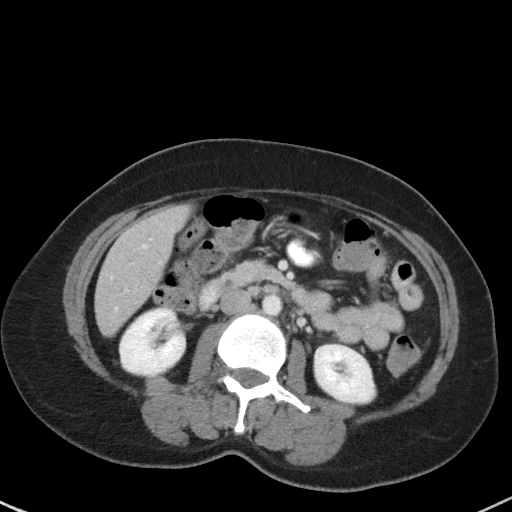
[im 56/86  bone]
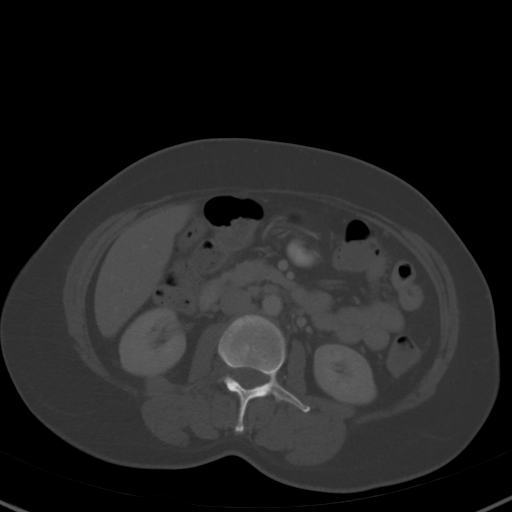
[im 60/86  soft-tissue]
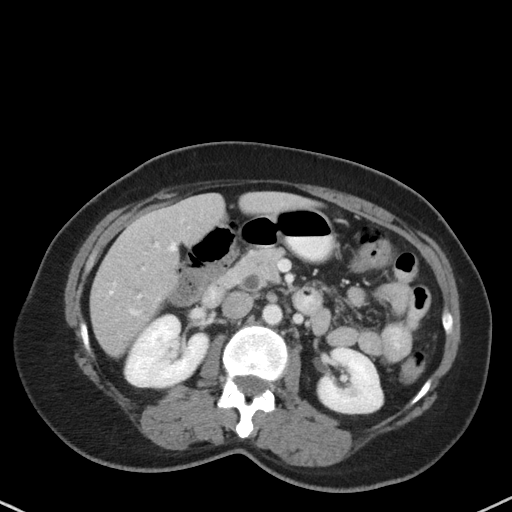
[im 69/86  soft-tissue]
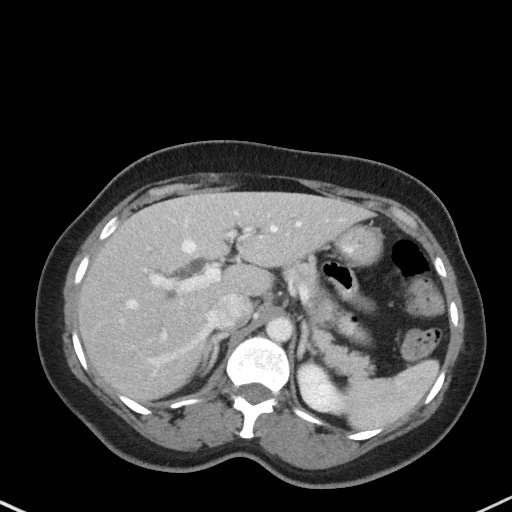
[im 73/86  soft-tissue]
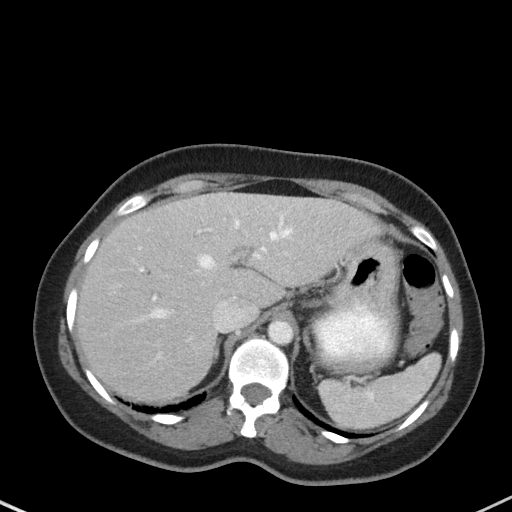
[im 81/86  soft-tissue]
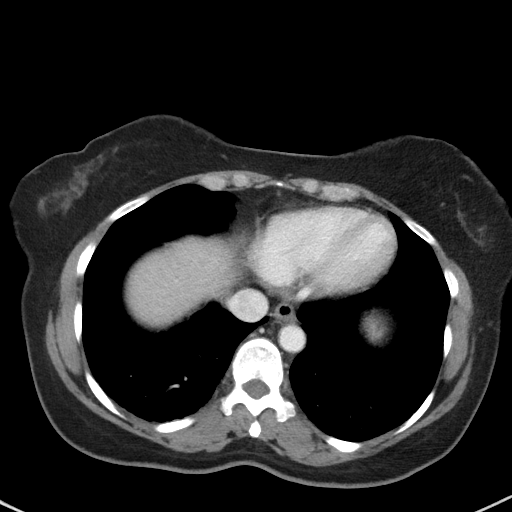

[Series 5: coronal st · coronal · 0.64mm/px · 3 of 82 slices shown]
[im 28/82  soft-tissue]
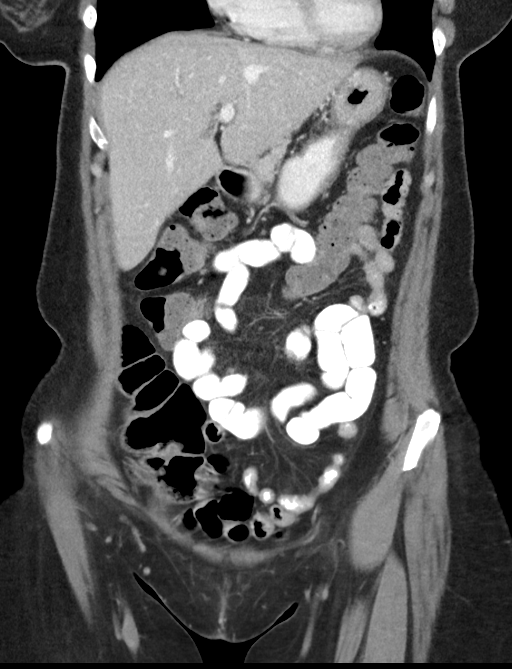
[im 37/82  soft-tissue]
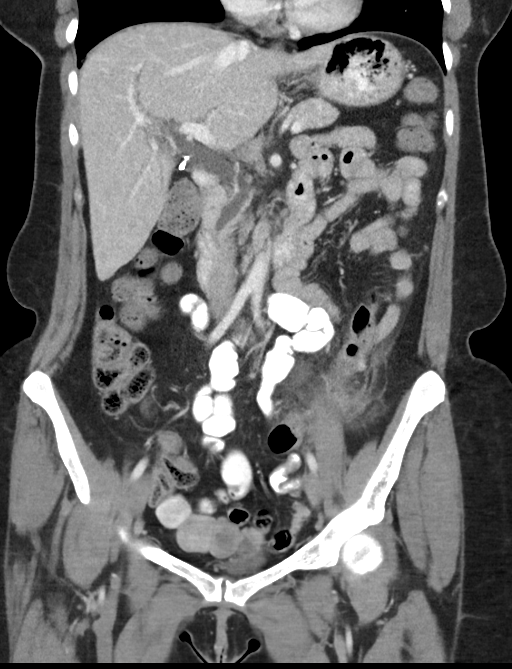
[im 46/82  soft-tissue]
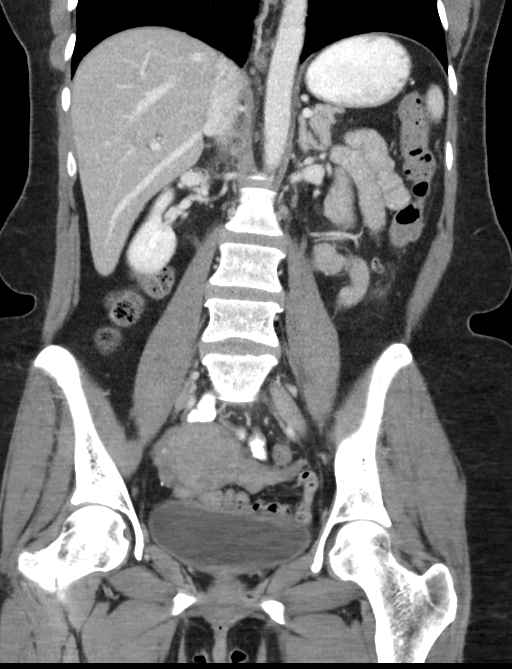

[16 of 46 positions shown; findings below may reference images not displayed]

FINDINGS: Lower chest: No acute abnormality.

Hepatobiliary: Status post cholecystectomy. No focal abnormality is
noted in the liver. Stable intrahepatic and extrahepatic biliary
dilatation is noted most consistent with post cholecystectomy
status.

Pancreas: Unremarkable. No pancreatic ductal dilatation or
surrounding inflammatory changes.

Spleen: Normal in size without focal abnormality.

Adrenals/Urinary Tract: Adrenal glands are unremarkable. Kidneys are
normal, without renal calculi, focal lesion, or hydronephrosis.
Bladder is unremarkable.

Stomach/Bowel: There is no evidence of bowel obstruction. Focal
diverticulitis is noted in the proximal sigmoid colon.

Vascular/Lymphatic: No significant vascular findings are present. No
enlarged abdominal or pelvic lymph nodes.

Reproductive: Uterus and bilateral adnexa are unremarkable.

Other: No abdominal wall hernia or abnormality. No abdominopelvic
ascites.

Musculoskeletal: No acute or significant osseous findings.
IMPRESSION: Focal diverticulitis of proximal sigmoid colon. No definite abscess
is noted.

## 2018-12-26 ENCOUNTER — Other Ambulatory Visit: Payer: Self-pay | Admitting: Internal Medicine

## 2018-12-26 DIAGNOSIS — B182 Chronic viral hepatitis C: Secondary | ICD-10-CM

## 2019-01-10 ENCOUNTER — Ambulatory Visit
Admission: RE | Admit: 2019-01-10 | Discharge: 2019-01-10 | Disposition: A | Discharge status: Home / Self Care | Payer: Managed Care, Other (non HMO) | Source: Ambulatory Visit | Attending: Internal Medicine | Admitting: Internal Medicine

## 2019-01-10 DIAGNOSIS — B182 Chronic viral hepatitis C: Secondary | ICD-10-CM

## 2021-08-13 IMAGING — US US ABDOMEN LIMITED W/ ELASTOGRAPHY
1 series · 12 of 25 positions shown · non-contrast
Comparison: None.

CLINICAL DATA: Chronic hepatitis-C without hepatic coma.

EXAM:
US ABDOMEN LIMITED - RIGHT UPPER QUADRANT
ULTRASOUND HEPATIC ELASTOGRAPHY
TECHNIQUE: Sonography of the right upper quadrant was performed. In addition,
ultrasound elastography evaluation of the liver was performed. A
region of interest was placed within the right lobe of the liver.
Following application of a compressive sonographic pulse, tissue
compressibility was assessed. Multiple assessments were performed at
the selected site. Median tissue compressibility was determined.
Previously, hepatic stiffness was assessed by shear wave velocity.
Based on recently published [employer] in [employer]
consensus article, reporting is now recommended to be performed in
the SI units of pressure (kiloPascals) representing hepatic
stiffness/elasticity. The obtained result is compared to the
published reference standards. (cACLD= compensated Advanced Chronic
Liver Disease)

[Series 1: us abdomen limited w/ elastography · 0.19mm/px · 12 of 37 slices shown]
[im 2/37]
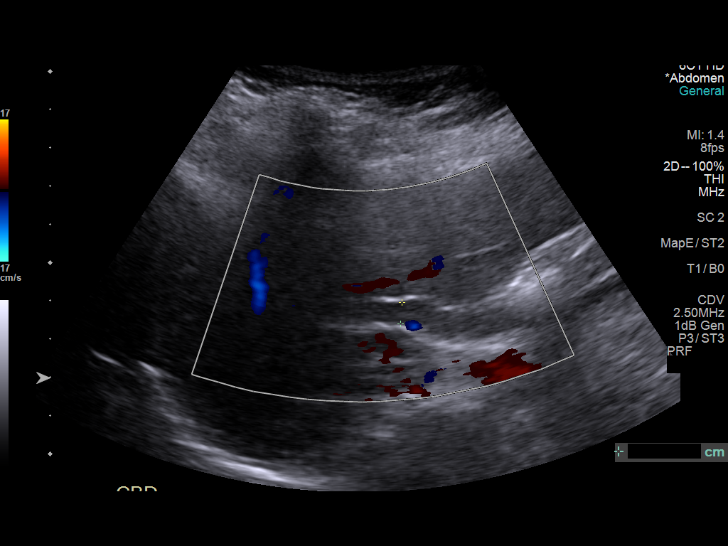
[im 5/37]
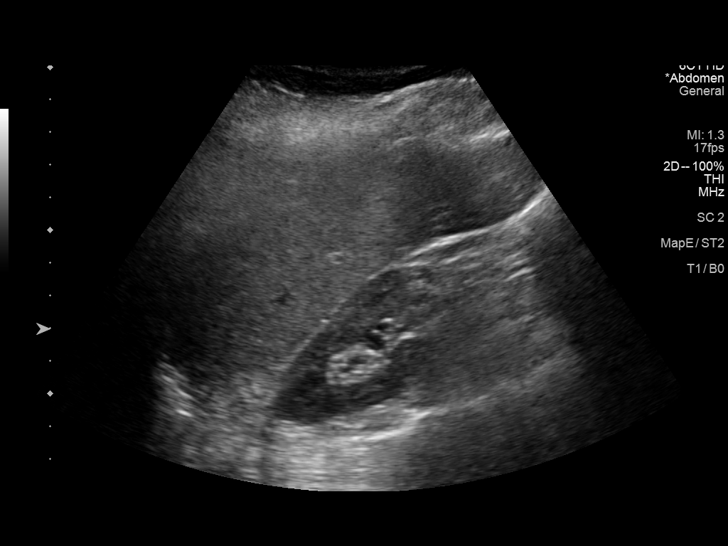
[im 8/37]
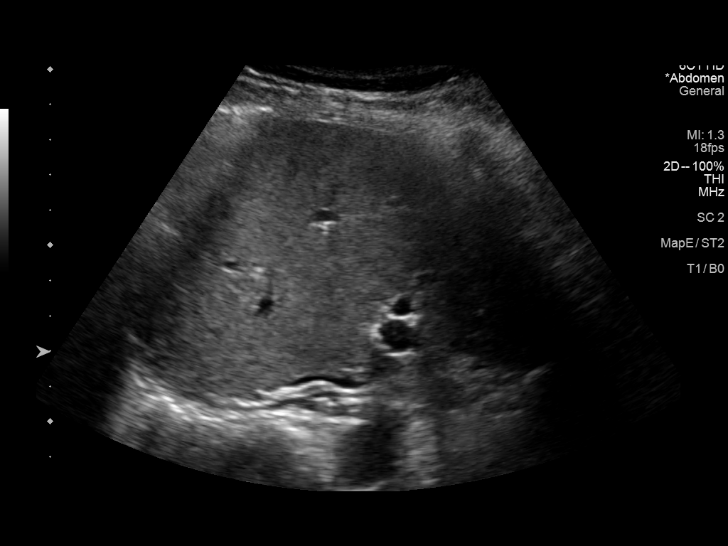
[im 11/37]
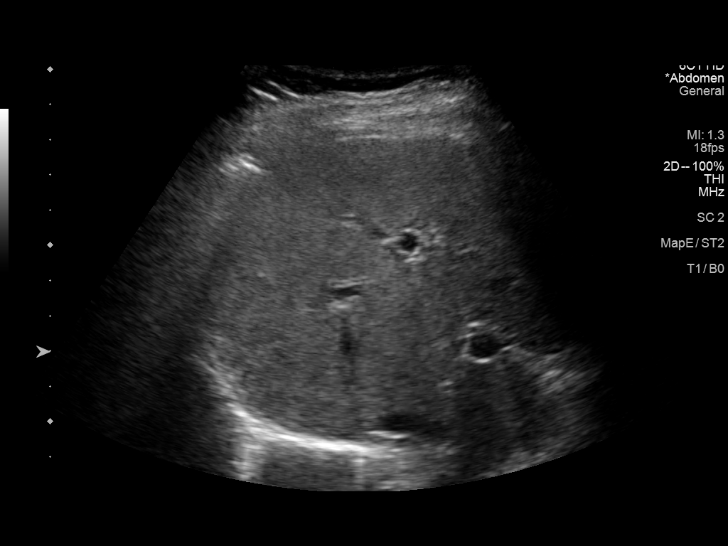
[im 14/37]
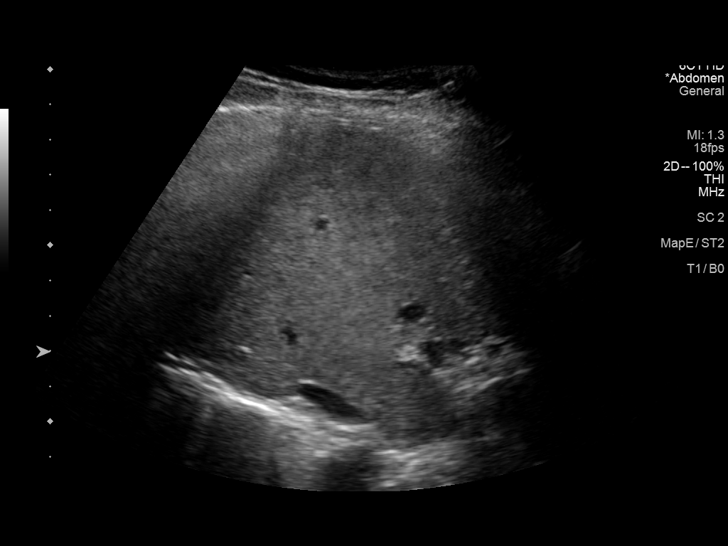
[im 17/37]
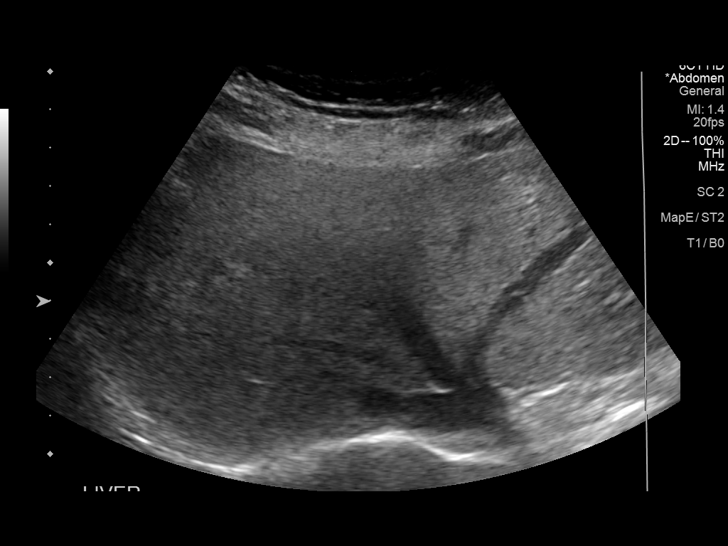
[im 20/37]
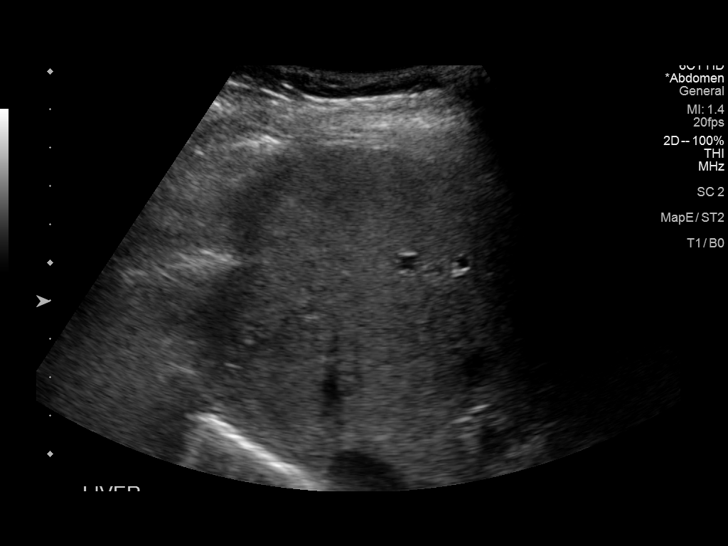
[im 23/37]
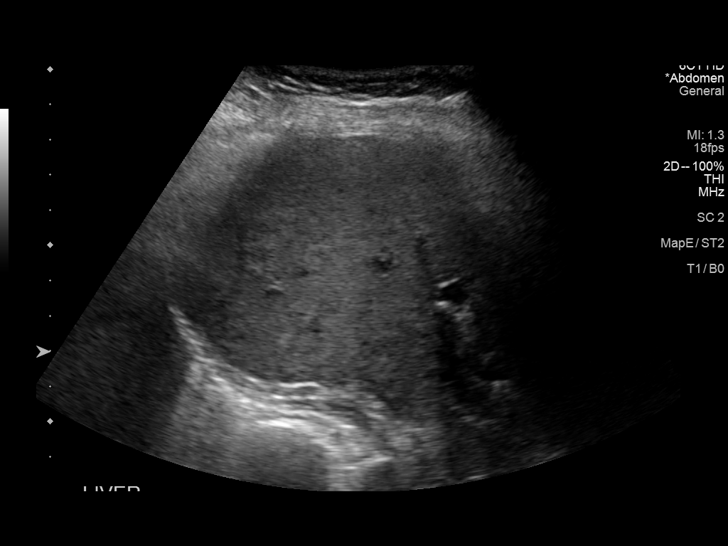
[im 26/37]
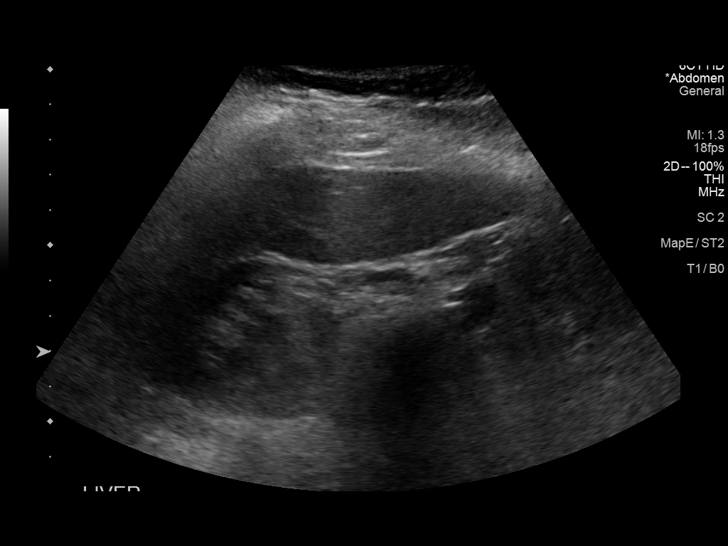
[im 29/37]
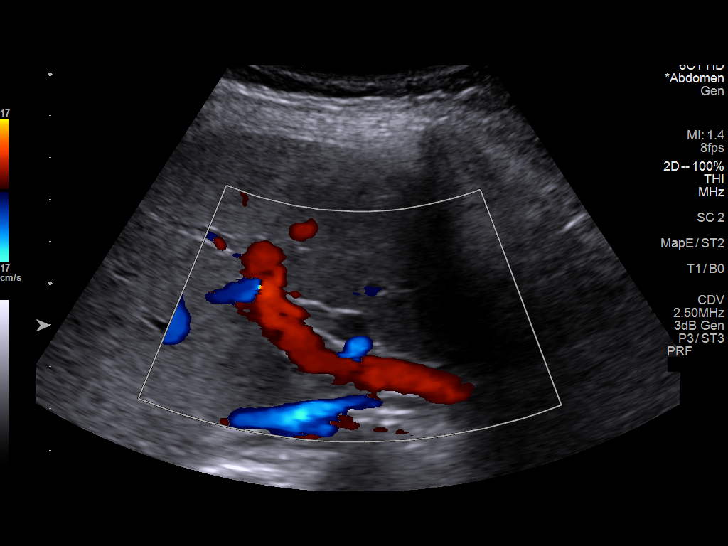
[im 32/37]
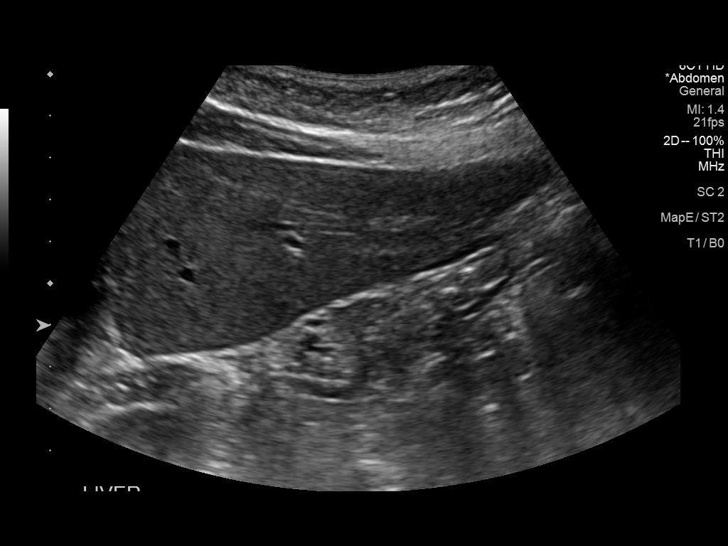
[im 35/37]
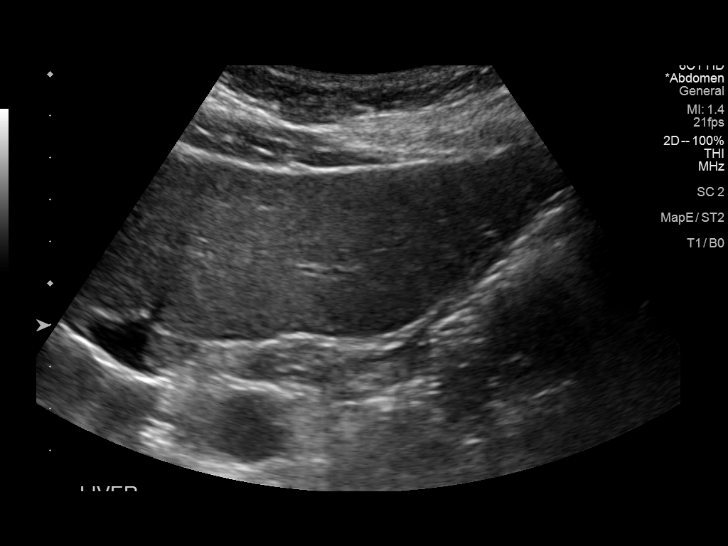

[12 of 25 positions shown; findings below may reference images not displayed]

FINDINGS: ULTRASOUND ABDOMEN LIMITED RIGHT UPPER QUADRANT

Gallbladder:

Surgically absent.

Common bile duct:

Diameter: 6 mm, within normal limits.

Liver:

Mildly increased echogenicity of the hepatic parenchyma, consistent
with hepatic steatosis. No hepatic mass identified. Portal vein is
patent on color Doppler imaging with normal direction of blood flow
towards the liver.

ULTRASOUND HEPATIC ELASTOGRAPHY

Device: Siemens Helix VTQ

Patient position: Oblique

Transducer [id number]

Number of measurements: 10

Hepatic segment:  8

Median kPa:

IQR:

IQR/Median kPa ratio:

Data quality:  Good

Diagnostic category:  ?5 kPa: high probability of being normal
IMPRESSION: ULTRASOUND RUQ:

Mild hepatic steatosis.  No liver mass identified.

Prior cholecystectomy.  No evidence of biliary ductal dilatation.

ULTRASOUND HEPATIC ELASTOGRAPHY:

Median kPa:

Diagnostic category:  ?5 kPa: high probability of being normal

The use of hepatic elastography is applicable to patients with viral
hepatitis and non-alcoholic fatty liver disease. At this time, there
is insufficient data for the referenced cut-off values and use in
other causes of liver disease, including alcoholic liver disease.
Patients, however, may be assessed by elastography and serve as
their own reference standard/baseline.

In patients with non-alcoholic liver disease, the values suggesting
compensated advanced chronic liver disease (cACLD) may be lower, and
patients may need additional testing with elasticity results of [DATE]
kPa.

Please note that abnormal hepatic elasticity and shear wave
velocities may also be identified in clinical settings other than
with hepatic fibrosis, such as: acute hepatitis, elevated right
heart and central venous pressures including use of beta blockers,
Soya disease ([place]), infiltrative processes such as
mastocytosis/amyloidosis/infiltrative tumor/lymphoma, extrahepatic
cholestasis, with hyperemia in the post-prandial state, and with
liver transplantation. Correlation with patient history, laboratory
data, and clinical condition recommended.

Diagnostic Categories:

?5 kPa: high probability of being normal

?9 kPa: in the absence of other known clinical signs, rules [DATE] kPa and ?13 kPa: suggestive of cACLD, but needs further testing

>13 kPa: highly suggestive of cACLD

?17 kPa: highly suggestive of cACLD with an increased probability of
clinically significant portal hypertension

## 2021-08-13 IMAGING — US US ABDOMEN LIMITED W/ ELASTOGRAPHY
1 series · 12 of 12 positions shown · non-contrast
Comparison: None.

CLINICAL DATA: Chronic hepatitis-C without hepatic coma.

EXAM:
US ABDOMEN LIMITED - RIGHT UPPER QUADRANT
ULTRASOUND HEPATIC ELASTOGRAPHY
TECHNIQUE: Sonography of the right upper quadrant was performed. In addition,
ultrasound elastography evaluation of the liver was performed. A
region of interest was placed within the right lobe of the liver.
Following application of a compressive sonographic pulse, tissue
compressibility was assessed. Multiple assessments were performed at
the selected site. Median tissue compressibility was determined.
Previously, hepatic stiffness was assessed by shear wave velocity.
Based on recently published [employer] in Ultrasound
consensus article, reporting is now recommended to be performed in
the SI units of pressure (kiloPascals) representing hepatic
stiffness/elasticity. The obtained result is compared to the
published reference standards. (cACLD= compensated Advanced Chronic
Liver Disease)

[Series 1: us abdomen limited w/ elastography · 0.12mm/px · 12 of 12 slices shown]
[im 1/12]
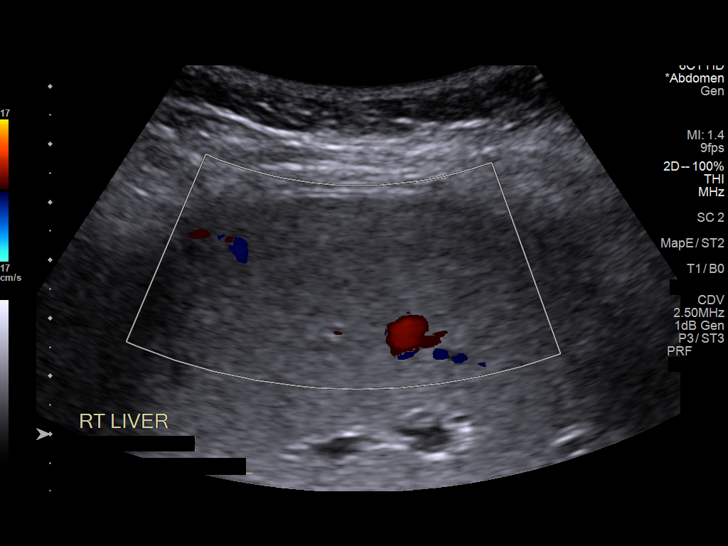
[im 2/12]
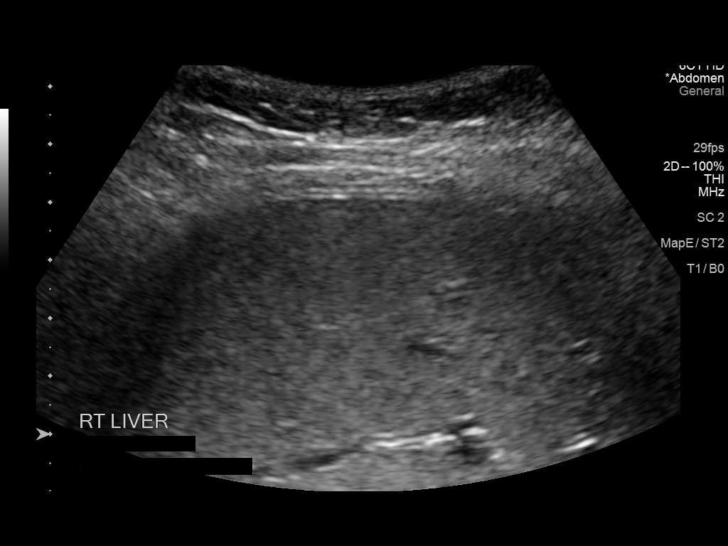
[im 3/12]
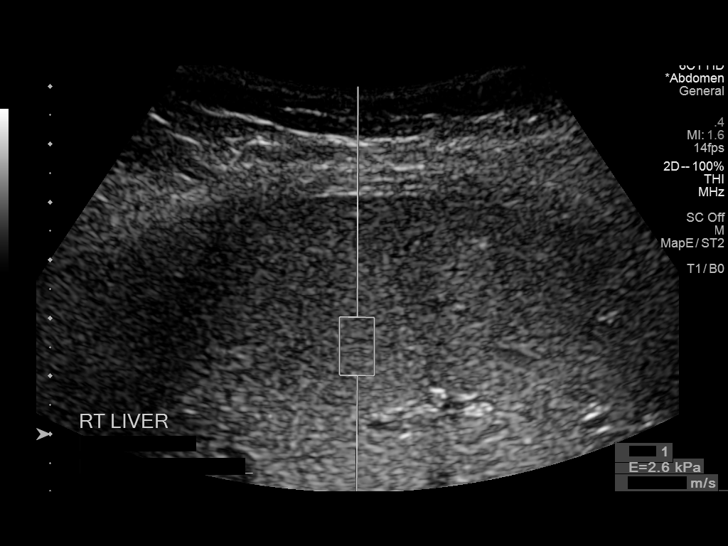
[im 4/12]
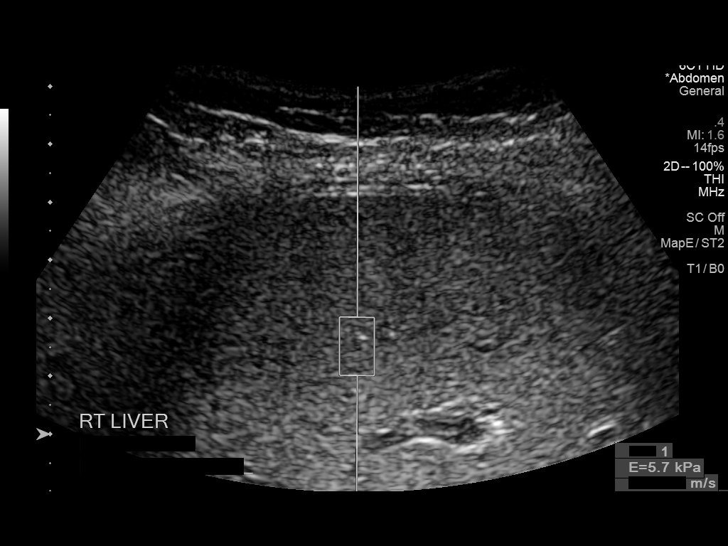
[im 5/12]
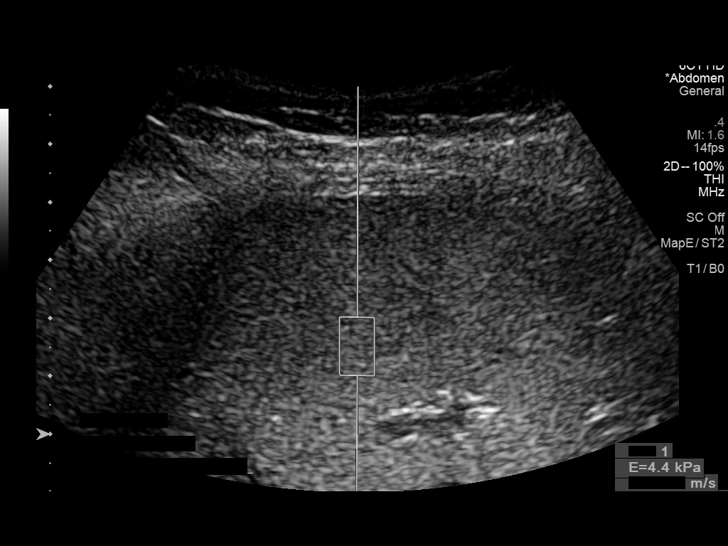
[im 6/12]
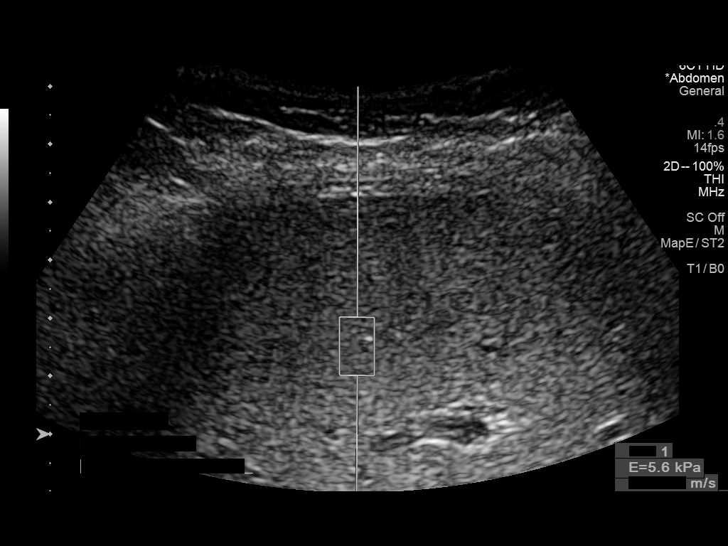
[im 7/12]
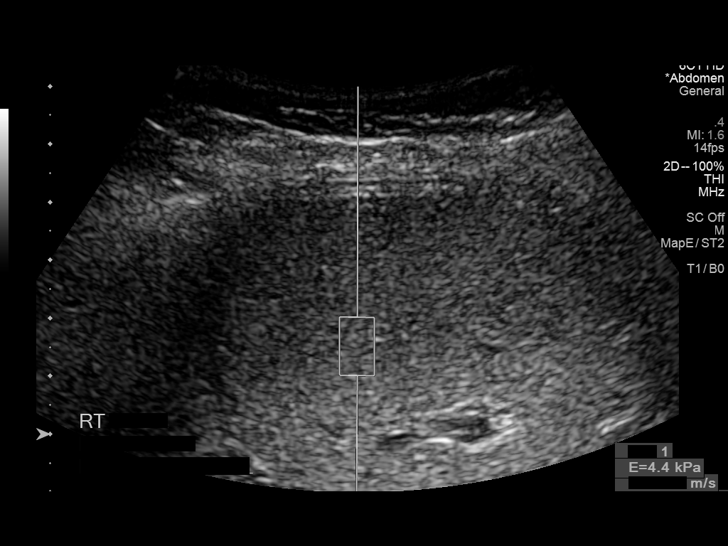
[im 8/12]
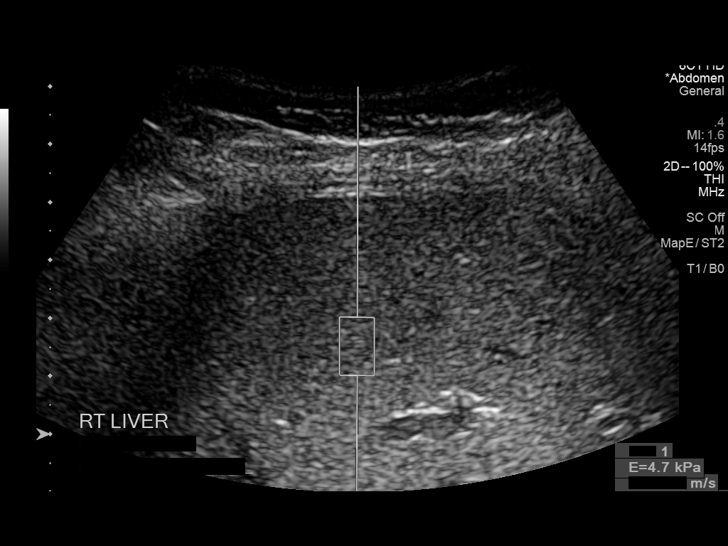
[im 9/12]
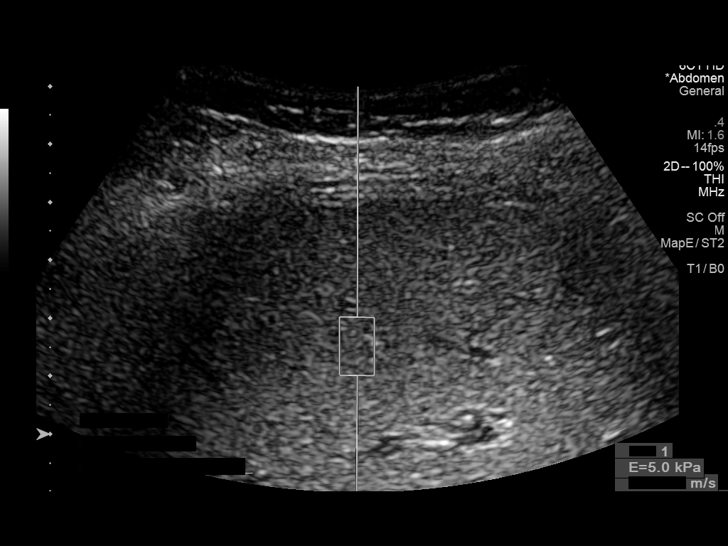
[im 10/12]
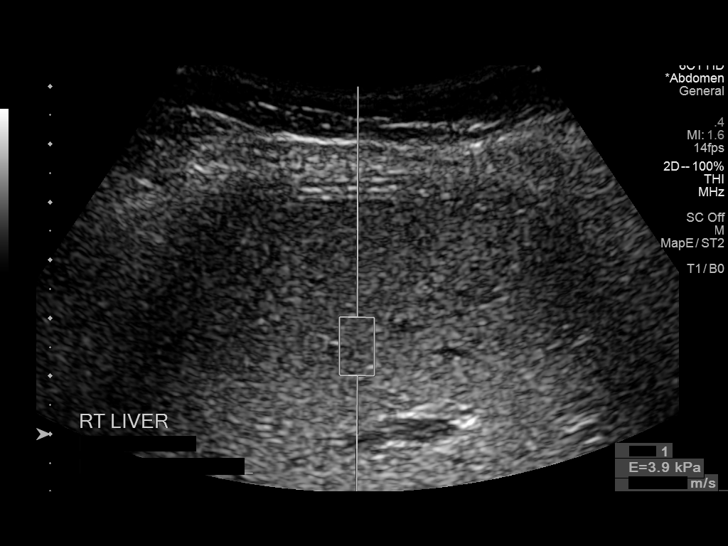
[im 11/12]
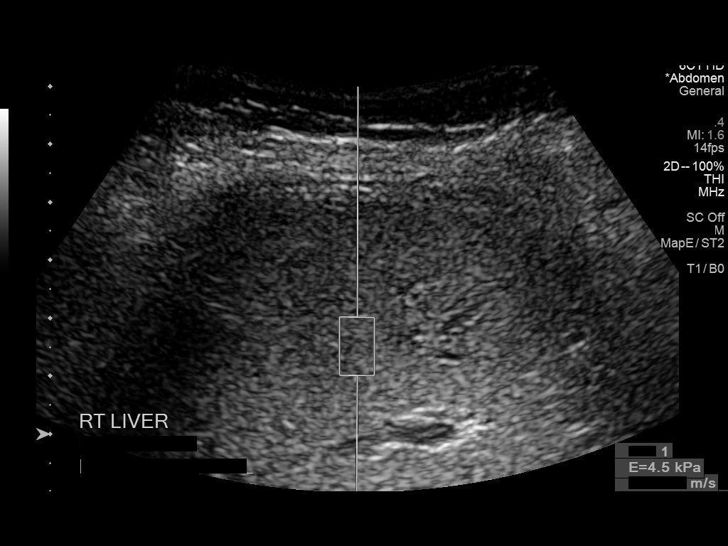
[im 12/12]
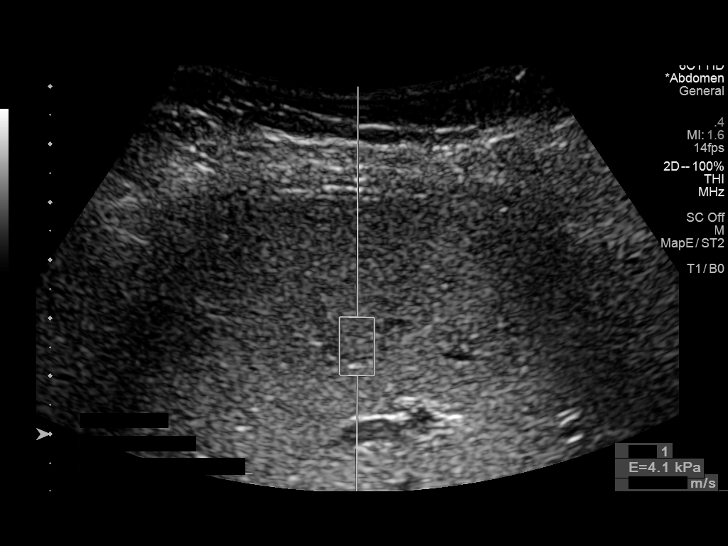

[12 of 12 positions shown; findings below may reference images not displayed]

FINDINGS: ULTRASOUND ABDOMEN LIMITED RIGHT UPPER QUADRANT

Gallbladder:

Surgically absent.

Common bile duct:

Diameter: 6 mm, within normal limits.

Liver:

Mildly increased echogenicity of the hepatic parenchyma, consistent
with hepatic steatosis. No hepatic mass identified. Portal vein is
patent on color Doppler imaging with normal direction of blood flow
towards the liver.

ULTRASOUND HEPATIC ELASTOGRAPHY

Device: Siemens Helix VTQ

Patient position: Oblique

Transducer [id number]

Number of measurements: 10

Hepatic segment:  8

Median kPa:

IQR:

IQR/Median kPa ratio:

Data quality:  Good

Diagnostic category:  ?5 kPa: high probability of being normal
IMPRESSION: ULTRASOUND RUQ:

Mild hepatic steatosis.  No liver mass identified.

Prior cholecystectomy.  No evidence of biliary ductal dilatation.

ULTRASOUND HEPATIC ELASTOGRAPHY:

Median kPa:

Diagnostic category:  ?5 kPa: high probability of being normal

The use of hepatic elastography is applicable to patients with viral
hepatitis and non-alcoholic fatty liver disease. At this time, there
is insufficient data for the referenced cut-off values and use in
other causes of liver disease, including alcoholic liver disease.
Patients, however, may be assessed by elastography and serve as
their own reference standard/baseline.

In patients with non-alcoholic liver disease, the values suggesting
compensated advanced chronic liver disease (cACLD) may be lower, and
patients may need additional testing with elasticity results of [DATE]
kPa.

Please note that abnormal hepatic elasticity and shear wave
velocities may also be identified in clinical settings other than
with hepatic fibrosis, such as: acute hepatitis, elevated right
heart and central venous pressures including use of beta blockers,
Soya disease ([place]), infiltrative processes such as
mastocytosis/amyloidosis/infiltrative tumor/lymphoma, extrahepatic
cholestasis, with hyperemia in the post-prandial state, and with
liver transplantation. Correlation with patient history, laboratory
data, and clinical condition recommended.

Diagnostic Categories:

?5 kPa: high probability of being normal

?9 kPa: in the absence of other known clinical signs, rules [DATE] kPa and ?13 kPa: suggestive of cACLD, but needs further testing

>13 kPa: highly suggestive of cACLD

?17 kPa: highly suggestive of cACLD with an increased probability of
clinically significant portal hypertension

## 2021-11-17 ENCOUNTER — Other Ambulatory Visit: Payer: Self-pay

## 2021-11-17 MED ORDER — INFLUENZA VAC SPLIT QUAD 0.5 ML IM SUSY
PREFILLED_SYRINGE | INTRAMUSCULAR | 0 refills | Status: AC
  Filled 2021-11-17: qty 0.5, 1d supply, fill #0

## 2021-11-18 ENCOUNTER — Other Ambulatory Visit: Payer: Self-pay

## 2022-04-16 ENCOUNTER — Other Ambulatory Visit (HOSPITAL_COMMUNITY): Payer: Self-pay

## 2023-10-18 ENCOUNTER — Other Ambulatory Visit: Payer: Self-pay

## 2023-10-18 MED ORDER — FLUZONE 0.5 ML IM SUSY
0.5000 mL | PREFILLED_SYRINGE | Freq: Once | INTRAMUSCULAR | 0 refills | Status: AC
  Filled 2023-10-18: qty 0.5, 1d supply, fill #0
